# Patient Record
Sex: Female | Born: 1965 | Race: White | Hispanic: No | Marital: Single | Smoking: Never smoker
Health system: Southern US, Community
[De-identification: ages and names within clinical notes are randomized; demographics above are authoritative.]

## PROBLEM LIST (undated history)

## (undated) DIAGNOSIS — M549 Dorsalgia, unspecified: Secondary | ICD-10-CM

## (undated) DIAGNOSIS — M722 Plantar fascial fibromatosis: Secondary | ICD-10-CM

## (undated) DIAGNOSIS — M5116 Intervertebral disc disorders with radiculopathy, lumbar region: Secondary | ICD-10-CM

## (undated) DIAGNOSIS — M47816 Spondylosis without myelopathy or radiculopathy, lumbar region: Secondary | ICD-10-CM

## (undated) DIAGNOSIS — G8929 Other chronic pain: Secondary | ICD-10-CM

## (undated) DIAGNOSIS — Z87891 Personal history of nicotine dependence: Secondary | ICD-10-CM

## (undated) DIAGNOSIS — M1990 Unspecified osteoarthritis, unspecified site: Secondary | ICD-10-CM

## (undated) DIAGNOSIS — M810 Age-related osteoporosis without current pathological fracture: Secondary | ICD-10-CM

## (undated) HISTORY — DX: Plantar fascial fibromatosis: M72.2

## (undated) HISTORY — PX: ABDOMINAL HYSTERECTOMY: SHX81

## (undated) HISTORY — PX: PARTIAL HYSTERECTOMY: SHX80

## (undated) HISTORY — DX: Spondylosis without myelopathy or radiculopathy, lumbar region: M47.816

## (undated) HISTORY — PX: TONSILLECTOMY: SUR1361

## (undated) HISTORY — DX: Intervertebral disc disorders with radiculopathy, lumbar region: M51.16

## (undated) HISTORY — DX: Dorsalgia, unspecified: M54.9

## (undated) HISTORY — PX: COLONOSCOPY: SHX174

## (undated) HISTORY — DX: Personal history of nicotine dependence: Z87.891

## (undated) HISTORY — DX: Other chronic pain: G89.29

## (undated) HISTORY — DX: Age-related osteoporosis without current pathological fracture: M81.0

## (undated) HISTORY — DX: Unspecified osteoarthritis, unspecified site: M19.90

---

## 2000-02-21 ENCOUNTER — Other Ambulatory Visit: Admission: RE | Admit: 2000-02-21 | Discharge: 2000-02-21 | Payer: Self-pay | Admitting: Gynecology

## 2001-02-12 ENCOUNTER — Ambulatory Visit (HOSPITAL_COMMUNITY): Admission: RE | Admit: 2001-02-12 | Discharge: 2001-02-12 | Payer: Self-pay | Admitting: Gastroenterology

## 2017-08-30 DIAGNOSIS — M722 Plantar fascial fibromatosis: Secondary | ICD-10-CM | POA: Insufficient documentation

## 2017-08-30 DIAGNOSIS — M6701 Short Achilles tendon (acquired), right ankle: Secondary | ICD-10-CM | POA: Insufficient documentation

## 2018-01-20 DIAGNOSIS — M5136 Other intervertebral disc degeneration, lumbar region: Secondary | ICD-10-CM | POA: Insufficient documentation

## 2018-01-20 DIAGNOSIS — M47816 Spondylosis without myelopathy or radiculopathy, lumbar region: Secondary | ICD-10-CM | POA: Insufficient documentation

## 2018-01-20 DIAGNOSIS — M51369 Other intervertebral disc degeneration, lumbar region without mention of lumbar back pain or lower extremity pain: Secondary | ICD-10-CM | POA: Insufficient documentation

## 2018-02-17 DIAGNOSIS — M5416 Radiculopathy, lumbar region: Secondary | ICD-10-CM | POA: Insufficient documentation

## 2018-08-27 ENCOUNTER — Other Ambulatory Visit (HOSPITAL_COMMUNITY): Payer: Self-pay | Admitting: Neurosurgery

## 2018-08-27 ENCOUNTER — Other Ambulatory Visit: Payer: Self-pay | Admitting: Neurosurgery

## 2018-08-27 DIAGNOSIS — M5441 Lumbago with sciatica, right side: Secondary | ICD-10-CM

## 2018-09-03 MED ORDER — SODIUM CHLORIDE 0.9 % IV SOLN: 4.0000 mg | Freq: Four times a day (QID) | INTRAVENOUS | Status: DC | PRN

## 2018-09-05 ENCOUNTER — Ambulatory Visit (HOSPITAL_COMMUNITY)
Admission: RE | Admit: 2018-09-05 | Discharge: 2018-09-05 | Disposition: A | Discharge status: Home / Self Care | Payer: BLUE CROSS/BLUE SHIELD | Source: Ambulatory Visit | Attending: Neurosurgery | Admitting: Neurosurgery

## 2018-09-05 ENCOUNTER — Other Ambulatory Visit: Payer: Self-pay

## 2018-09-05 ENCOUNTER — Encounter (HOSPITAL_COMMUNITY): Payer: Self-pay | Admitting: *Deleted

## 2018-09-05 DIAGNOSIS — M5441 Lumbago with sciatica, right side: Secondary | ICD-10-CM | POA: Insufficient documentation

## 2018-09-05 DIAGNOSIS — M5126 Other intervertebral disc displacement, lumbar region: Secondary | ICD-10-CM | POA: Diagnosis not present

## 2018-09-05 DIAGNOSIS — M5136 Other intervertebral disc degeneration, lumbar region: Secondary | ICD-10-CM | POA: Insufficient documentation

## 2018-09-05 MED ORDER — LIDOCAINE HCL (PF) 1 % IJ SOLN
2.0000 mL | Freq: Once | INTRAMUSCULAR | Status: AC
  Administered 2018-09-05: 5 mL via INTRADERMAL

## 2018-09-05 MED ORDER — DIAZEPAM 5 MG PO TABS
10.0000 mg | ORAL_TABLET | Freq: Once | ORAL | Status: AC
  Administered 2018-09-05: 10 mg via ORAL
  Filled 2018-09-05: qty 2

## 2018-09-05 MED ORDER — ONDANSETRON HCL 4 MG/2ML IJ SOLN: 4.0000 mg | Freq: Four times a day (QID) | INTRAMUSCULAR | Status: DC | PRN

## 2018-09-05 MED ORDER — OXYCODONE HCL 5 MG PO TABS
5.0000 mg | ORAL_TABLET | ORAL | Status: DC | PRN
  Administered 2018-09-05: 5 mg via ORAL

## 2018-09-05 MED ORDER — IOPAMIDOL (ISOVUE-M 200) INJECTION 41%
20.0000 mL | Freq: Once | INTRAMUSCULAR | Status: AC
  Administered 2018-09-05: 20 mL via INTRATHECAL

## 2018-09-05 MED ORDER — DIAZEPAM 5 MG PO TABS
ORAL_TABLET | ORAL | Status: AC
  Filled 2018-09-05: qty 2

## 2018-09-05 MED ORDER — OXYCODONE HCL 5 MG PO TABS
ORAL_TABLET | ORAL | Status: AC
  Filled 2018-09-05: qty 1

## 2018-09-05 NOTE — Discharge Instructions (Signed)
Myelogram, Care After °Refer to this sheet in the next few weeks. These instructions provide you with information about caring for yourself after your procedure. Your health care provider may also give you more specific instructions. Your treatment has been planned according to current medical practices, but problems sometimes occur. Call your health care provider if you have any problems or questions after your procedure. °What can I expect after the procedure? °After the procedure, it is common to have: °· Soreness at your injection site. °· A mild headache. ° °Follow these instructions at home: °· Drink enough fluid to keep your urine clear or pale yellow. This will help flush out the dye (contrast material) from your spine. °· Rest as told by your health care provider. Lie flat with your head slightly raised (elevated) to reduce the risk of headache. °· Do not bend, lift, or do any strenuous activity for 24-48 hours or as told by your health care provider. °· Take over-the-counter and prescription medicines only as told by your health care provider. °· Take care of and remove your bandage (dressing) as told by your health care provider. °· Bathe or shower as told by your health care provider. °Contact a health care provider if: °· You have a fever. °· You have a headache that lasts longer than 24 hours. °· You feel nauseous or vomit. °· You have a stiff neck or numbness in your legs. °· You are unable to urinate or have a bowel movement. °· You develop a rash, itching, or sneezing. °Get help right away if: °· You have new symptoms or your symptoms get worse. °· You have a seizure. °· You have trouble breathing. °This information is not intended to replace advice given to you by your health care provider. Make sure you discuss any questions you have with your health care provider. °Document Released: 12/23/2015 Document Revised: 05/03/2016 Document Reviewed: 09/08/2015 °Elsevier Interactive Patient Education ©  2018 [employer] ° °

## 2018-09-05 NOTE — Op Note (Signed)
09/05/2018 Lumbar Myelogram  PATIENT:  Kendra Mann is a 52 y.o. female with back and lower extremity pain  PRE-OPERATIVE DIAGNOSIS:  Right lower extremity pain  POST-OPERATIVE DIAGNOSIS:  same  PROCEDURE:  Lumbar Myelogram  SURGEON:  Delance Weide  ANESTHESIA:   local LOCAL MEDICATIONS USED:  LIDOCAINE  Procedure Note: [name] is a 52 y.o. female Was taken to the fluoroscopy suite and  positioned prone on the fluoroscopy table. Her back was prepared and draped in a sterile manner. I infiltrated 10 cc into the lumbar region. I then introduced a spinal needle into the thecal sac at the L3/4 interlaminar space. I infiltrated 20cc of Isovue 200 into the thecal sac. Fluoroscopy showed the needle and contrast in the thecal sac. [name] tolerated the procedure well. she Will be taken to CT for evaluation.     PATIENT DISPOSITION:  Short Stay

## 2018-10-22 ENCOUNTER — Other Ambulatory Visit: Payer: Self-pay | Admitting: *Deleted

## 2018-10-22 ENCOUNTER — Other Ambulatory Visit: Payer: Self-pay | Admitting: Neurosurgery

## 2018-10-23 ENCOUNTER — Telehealth: Payer: Self-pay | Admitting: Vascular Surgery

## 2018-10-23 NOTE — Telephone Encounter (Signed)
sch appt spk to pt mld ltr 12/16/18 1130am f/u MD

## 2018-10-23 NOTE — Telephone Encounter (Signed)
-----   Message from Retta Macebecca J Roberts, RN sent at 10/22/2018 10:18 AM EST ----- Regarding: OFFICE APPOINTMENT Please schedule office appointment with Dr. Chestine SporeLARK. ALIF surgery is scheduled for 12/26/2018 with Dr. Franky Machoabbell. Remind patient to bring all film related to this surgery that are not in Epic. Thanks, [employer] EnergyBecky

## 2018-11-12 ENCOUNTER — Encounter: Payer: Self-pay | Admitting: *Deleted

## 2018-12-16 ENCOUNTER — Encounter: Payer: Self-pay | Admitting: Vascular Surgery

## 2018-12-16 ENCOUNTER — Other Ambulatory Visit: Payer: Self-pay

## 2018-12-16 ENCOUNTER — Ambulatory Visit (INDEPENDENT_AMBULATORY_CARE_PROVIDER_SITE_OTHER): Payer: BLUE CROSS/BLUE SHIELD | Admitting: Vascular Surgery

## 2018-12-16 VITALS — BP 137/95 | HR 76 | Temp 97.0°F | Resp 20 | Ht 66.0 in | Wt 252.0 lb

## 2018-12-16 DIAGNOSIS — M5136 Other intervertebral disc degeneration, lumbar region: Secondary | ICD-10-CM | POA: Diagnosis not present

## 2018-12-16 DIAGNOSIS — M51369 Other intervertebral disc degeneration, lumbar region without mention of lumbar back pain or lower extremity pain: Secondary | ICD-10-CM

## 2018-12-16 NOTE — Progress Notes (Signed)
Patient name: Katharina CaperMelissa L Valtierra MRN: [id number] DOB: 11-27-1966 Sex: female  REASON FOR CONSULT: New evaluation prior to L4-L5 ALIF  HPI: Mystie L Standlee is a 53 y.o. female, with history of morbid obesity that presents for preop evaluation for L4-L5 AlLIF with Dr. Franky Machoabbell.  Patient states she has had pretty profound lower back pain that has limited her mobility.  As a result she has gained over 100 pounds.  She has been evaluated by Dr. Franky Machoabbell and he has recommended an L4-L5 anterior fusion next Friday.  Patient denies any previous abdominal surgeries other than a laparoscopic procedure through her bellybutton.  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  She also had a transvaginal hysterectomy.  No blood thinners.    Past Medical History:  Diagnosis Date  . Back pain   . Lumbar disc disease with radiculopathy   . Lumbar spondylosis   . Plantar fasciitis    PSH: Laparoscopic procedure through belly button, transvaginal hysterectomy  FH: No hx of bleeding disorders or issues with anesthesia   SOCIAL HISTORY: Social History   Socioeconomic History  . Marital status: Single    Spouse name: Not on file  . Number of children: Not on file  . Years of education: Not on file  . Highest education level: Not on file  Occupational History  . Not on file  Social Needs  . Financial resource strain: Not on file  . Food insecurity:    Worry: Not on file    Inability: Not on file  . Transportation needs:    Medical: Not on file    Non-medical: Not on file  Tobacco Use  . Smoking status: Never Smoker  . Smokeless tobacco: Never Used  Substance and Sexual Activity  . Alcohol use: Yes    Comment: occasionally  . Drug use: Never  . Sexual activity: Not on file  Lifestyle  . Physical activity:    Days per week: Not on file    Minutes per session: Not on file  . Stress: Not on file  Relationships  . Social connections:    Talks on phone: Not on file    Gets together: Not on file    Attends religious service: Not on file    Active member of club or organization: Not on file    Attends meetings of clubs or organizations: Not on file    Relationship status: Not on file  . Intimate partner violence:    Fear of current or ex partner: Not on file    Emotionally abused: Not on file    Physically abused: Not on file    Forced sexual activity: Not on file  Other Topics Concern  . Not on file  Social History Narrative  . Not on file    No Known Allergies  Current Outpatient Medications  Medication Sig Dispense Refill  . conjugated estrogens (PREMARIN) vaginal cream Place 0.5 Applicatorfuls vaginally 2 (two) times a week.    Marland Kitchen. HYDROcodone-acetaminophen (NORCO) 7.5-325 MG tablet Take 1 tablet by mouth every 4 (four) hours as needed for moderate pain or severe pain.     Marland Kitchen. ibuprofen (ADVIL,MOTRIN) 200 MG tablet Take 800 mg by mouth every 6 (six) hours as needed for headache or moderate pain.      No current facility-administered medications for this visit.    Facility-Administered Medications Ordered in Other Visits  Medication Dose Route Frequency Provider Last Rate Last Dose  . ondansetron (ZOFRAN) 4  mg in sodium chloride 0.9 % 50 mL IVPB  4 mg Intravenous Q6H PRN Coletta Memos, MD        REVIEW OF SYSTEMS:  [X]  denotes positive finding, [ ]  denotes negative finding Cardiac  Comments:  Chest pain or chest pressure:    Shortness of breath upon exertion:    Short of breath when lying flat:    Irregular heart rhythm:        Vascular    Pain in calf, thigh, or hip brought on by ambulation:    Pain in feet at night that wakes you up from your sleep:     Blood clot in your veins:    Leg swelling:         Pulmonary    Oxygen at home:    Productive cough:     Wheezing:         Neurologic    Sudden weakness in arms or legs:     Sudden numbness in arms or legs:     Sudden onset of difficulty speaking or slurred speech:    Temporary loss of vision in one eye:       Problems with dizziness:         Gastrointestinal    Blood in stool:     Vomited blood:         Genitourinary    Burning when urinating:     Blood in urine:        Psychiatric    Major depression:         Hematologic    Bleeding problems:    Problems with blood clotting too easily:        Skin    Rashes or ulcers:        Constitutional    Fever or chills:      PHYSICAL EXAM: Vitals:   12/16/18 1111  BP: (!) 137/95  Pulse: 76  Resp: 20  Temp: (!) 97 F (36.1 C)  TempSrc: Oral  SpO2: 100%  Weight: 252 lb (114.3 kg)  Height: 5\' 6"  (1.676 m)    GENERAL: The patient is a well-nourished female, in no acute distress. The vital signs are documented above. CARDIAC: There is a regular rate and rhythm.  VASCULAR:  2+ femoral pulse palpable bilateral groins 2+ DP palpable bilateral lower extremities PULMONARY: There is good air exchange bilaterally without wheezing or rales. ABDOMEN: Soft and non-tender.  Obese.   MUSCULOSKELETAL: There are no major deformities or cyanosis. NEUROLOGIC: No focal weakness or paresthesias are detected. SKIN: There are no ulcers or rashes noted. PSYCHIATRIC: The patient has a normal affect.  DATA:   I reviewed her CT lumbar spine and it appears that her aortic bifurcation is at the level of L4-L5.  No significant atherosclerotic disease apparent.  Without contrast difficult to see iliolumbar veins.  Assessment/Plan:  53 year old female scheduled for a L4-L5 ALIF with Dr. Franky Macho next Friday.  Discussed details of surgery with the patient today including plans for left paramedian incision with mobilization of the peritoneum, ureter, artery and veins to visualize disc space.  We discussed risks of the procedure including bowel injury, ureter injury, injury to arteries or veins with bleeding, etc.  Patient understands and wishes to proceed.  Discussed with her that this would be more difficult given her morbid obesity.  All questions answered  and look forward to assisting Dr. Franky Macho.     [name], MD [facility] Office: [phone]  Pager: [phone]   [name]

## 2018-12-18 NOTE — Pre-Procedure Instructions (Signed)
Hali L Isaacs  12/18/2018      [id number] - [place], [place] - [address] [place] [address] Phone: [phone] Fax: [phone]    Your procedure is scheduled on Friday January 17th.  Report to [facility] Admitting at 0530 A.M.  Call this number if you have problems the morning of surgery:  [phone]   Remember:  Do not eat or drink after midnight.    Take these medicines the morning of surgery with A SIP OF WATER  HYDROcodone-acetaminophen (NORCO) if needed  7 days prior to surgery STOP taking any Aspirin (unless otherwise instructed by your surgeon), Aleve, Naproxen, Ibuprofen, Motrin, Advil, Goody's, BC's, all herbal medications, fish oil, and all vitamins.     Do not wear jewelry, make-up or nail polish.  Do not wear lotions, powders, or perfumes, or deodorant.  Do not shave 48 hours prior to surgery.  Men may shave face and neck.  Do not bring valuables to the hospital.  [facility] is not responsible for any belongings or valuables.  Contacts, dentures or bridgework may not be worn into surgery.  Leave your suitcase in the car.  After surgery it may be brought to your room.  For patients admitted to the hospital, discharge time will be determined by your treatment team.  Patients discharged the day of surgery will not be allowed to drive home.    [place]- Preparing For Surgery  Before surgery, you can play an important role. Because skin is not sterile, your skin needs to be as free of germs as possible. You can reduce the number of germs on your skin by washing with CHG (chlorahexidine gluconate) Soap before surgery.  CHG is an antiseptic cleaner which kills germs and bonds with the skin to continue killing germs even after washing.    Oral Hygiene is also important to reduce your risk of infection.  Remember - BRUSH YOUR TEETH THE MORNING OF SURGERY WITH YOUR REGULAR TOOTHPASTE  Please do not use if you have  an allergy to CHG or antibacterial soaps. If your skin becomes reddened/irritated stop using the CHG.  Do not shave (including legs and underarms) for at least 48 hours prior to first CHG shower. It is OK to shave your face.  Please follow these instructions carefully.   1. Shower the NIGHT BEFORE SURGERY and the MORNING OF SURGERY with CHG.   2. If you chose to wash your hair, wash your hair first as usual with your normal shampoo.  3. After you shampoo, rinse your hair and body thoroughly to remove the shampoo.  4. Use CHG as you would any other liquid soap. You can apply CHG directly to the skin and wash gently with a scrungie or a clean washcloth.   5. Apply the CHG Soap to your body ONLY FROM THE NECK DOWN.  Do not use on open wounds or open sores. Avoid contact with your eyes, ears, mouth and genitals (private parts). Wash Face and genitals (private parts)  with your normal soap.  6. Wash thoroughly, paying special attention to the area where your surgery will be performed.  7. Thoroughly rinse your body with warm water from the neck down.  8. DO NOT shower/wash with your normal soap after using and rinsing off the CHG Soap.  9. Pat yourself dry with a CLEAN TOWEL.  10. Wear CLEAN PAJAMAS to bed the night before surgery, wear comfortable clothes the morning  of surgery  11. Place CLEAN SHEETS on your bed the night of your first shower and DO NOT SLEEP WITH PETS.    Day of Surgery:  Do not apply any deodorants/lotions.  Please wear clean clothes to the hospital/surgery center.   Remember to brush your teeth WITH YOUR REGULAR TOOTHPASTE.    Please read over the following fact sheets that you were given.

## 2018-12-19 ENCOUNTER — Encounter (HOSPITAL_COMMUNITY): Payer: Self-pay

## 2018-12-19 ENCOUNTER — Inpatient Hospital Stay (HOSPITAL_COMMUNITY)
Admission: RE | Admit: 2018-12-19 | Discharge: 2018-12-19 | Disposition: A | Discharge status: Home / Self Care | Payer: BLUE CROSS/BLUE SHIELD | Source: Ambulatory Visit

## 2018-12-19 ENCOUNTER — Other Ambulatory Visit: Payer: Self-pay

## 2018-12-19 NOTE — Progress Notes (Signed)
Pt scheduled pre-op appt but couldn't find parking and missed appt. Phone call made. Pt denies seeing cardiologist, having CP or cough. Pt informed to be NPO after MN and only take a norco in the morning if needed with a small sip of water.

## 2018-12-25 NOTE — Anesthesia Preprocedure Evaluation (Addendum)
Anesthesia Evaluation  Patient identified by MRN, date of birth, ID band Patient awake    Reviewed: Allergy & Precautions, H&P , NPO status , Patient's Chart, lab work & pertinent test results, reviewed documented beta blocker date and time   Airway Mallampati: II  TM Distance: >3 FB Neck ROM: full    Dental no notable dental hx. (+) Dental Advisory Given, Teeth Intact   Pulmonary neg pulmonary ROS,    Pulmonary exam normal breath sounds clear to auscultation       Cardiovascular Exercise Tolerance: Good negative cardio ROS   Rhythm:regular Rate:Normal     Neuro/Psych negative neurological ROS  negative psych ROS   GI/Hepatic negative GI ROS, Neg liver ROS,   Endo/Other  Morbid obesity  Renal/GU negative Renal ROS  negative genitourinary   Musculoskeletal   Abdominal   Peds  Hematology negative hematology ROS (+)   Anesthesia Other Findings   Reproductive/Obstetrics negative OB ROS                            Anesthesia Physical Anesthesia Plan  ASA: II  Anesthesia Plan: General   Post-op Pain Management:    Induction: Intravenous  PONV Risk Score and Plan: 3 and Ondansetron, Dexamethasone, Treatment may vary due to age or medical condition and Midazolam  Airway Management Planned: Oral ETT  Additional Equipment:   Intra-op Plan:   Post-operative Plan: Extubation in OR  Informed Consent: I have reviewed the patients History and Physical, chart, labs and discussed the procedure including the risks, benefits and alternatives for the proposed anesthesia with the patient or authorized representative who has indicated his/her understanding and acceptance.     Dental Advisory Given  Plan Discussed with: CRNA, Anesthesiologist and Surgeon  Anesthesia Plan Comments: (  )        Anesthesia Quick Evaluation

## 2018-12-26 ENCOUNTER — Inpatient Hospital Stay (HOSPITAL_COMMUNITY): Payer: BLUE CROSS/BLUE SHIELD | Admitting: Certified Registered"

## 2018-12-26 ENCOUNTER — Encounter (HOSPITAL_COMMUNITY)
Admission: RE | Disposition: A | Discharge status: Home Health | Payer: Self-pay | Source: Home / Self Care | Attending: Neurosurgery

## 2018-12-26 ENCOUNTER — Inpatient Hospital Stay (HOSPITAL_COMMUNITY): Payer: BLUE CROSS/BLUE SHIELD

## 2018-12-26 ENCOUNTER — Inpatient Hospital Stay (HOSPITAL_COMMUNITY)
Admission: RE | Admit: 2018-12-26 | Discharge: 2018-12-27 | DRG: 460 | Disposition: A | Discharge status: Home Health | Payer: BLUE CROSS/BLUE SHIELD | Attending: Neurosurgery | Admitting: Neurosurgery

## 2018-12-26 ENCOUNTER — Encounter (HOSPITAL_COMMUNITY): Payer: Self-pay

## 2018-12-26 DIAGNOSIS — M4726 Other spondylosis with radiculopathy, lumbar region: Secondary | ICD-10-CM | POA: Diagnosis present

## 2018-12-26 DIAGNOSIS — Z6841 Body Mass Index (BMI) 40.0 and over, adult: Secondary | ICD-10-CM

## 2018-12-26 DIAGNOSIS — Z79890 Hormone replacement therapy (postmenopausal): Secondary | ICD-10-CM | POA: Diagnosis not present

## 2018-12-26 DIAGNOSIS — Z90710 Acquired absence of both cervix and uterus: Secondary | ICD-10-CM | POA: Diagnosis not present

## 2018-12-26 DIAGNOSIS — M47816 Spondylosis without myelopathy or radiculopathy, lumbar region: Secondary | ICD-10-CM | POA: Diagnosis present

## 2018-12-26 DIAGNOSIS — M4316 Spondylolisthesis, lumbar region: Secondary | ICD-10-CM | POA: Diagnosis present

## 2018-12-26 DIAGNOSIS — Z79891 Long term (current) use of opiate analgesic: Secondary | ICD-10-CM

## 2018-12-26 DIAGNOSIS — M4306 Spondylolysis, lumbar region: Secondary | ICD-10-CM

## 2018-12-26 HISTORY — PX: ABDOMINAL EXPOSURE: SHX5708

## 2018-12-26 HISTORY — PX: ANTERIOR LUMBAR FUSION: SHX1170

## 2018-12-26 LAB — CBC
HCT: 38.5 % (ref 36.0–46.0)
Hemoglobin: 12.1 g/dL (ref 12.0–15.0)
MCH: 30.6 pg (ref 26.0–34.0)
MCHC: 31.4 g/dL (ref 30.0–36.0)
MCV: 97.2 fL (ref 80.0–100.0)
Platelets: 249 10*3/uL (ref 150–400)
RBC: 3.96 MIL/uL (ref 3.87–5.11)
RDW: 13.1 % (ref 11.5–15.5)
WBC: 6.6 10*3/uL (ref 4.0–10.5)
nRBC: 0 % (ref 0.0–0.2)

## 2018-12-26 LAB — COMPREHENSIVE METABOLIC PANEL
ALT: 16 U/L (ref 0–44)
AST: 17 U/L (ref 15–41)
Albumin: 4 g/dL (ref 3.5–5.0)
Alkaline Phosphatase: 55 U/L (ref 38–126)
Anion gap: 10 (ref 5–15)
BUN: 12 mg/dL (ref 6–20)
CO2: 24 mmol/L (ref 22–32)
Calcium: 9.4 mg/dL (ref 8.9–10.3)
Chloride: 103 mmol/L (ref 98–111)
Creatinine, Ser: 0.84 mg/dL (ref 0.44–1.00)
GFR calc Af Amer: >60 mL/min (ref >60)
GFR calc non Af Amer: >60 mL/min (ref >60)
Glucose, Bld: 97 mg/dL (ref 70–99)
Potassium: 3.9 mmol/L (ref 3.5–5.1)
Sodium: 137 mmol/L (ref 135–145)
Total Bilirubin: 0.5 mg/dL (ref 0.3–1.2)
Total Protein: 6.8 g/dL (ref 6.5–8.1)

## 2018-12-26 LAB — TYPE AND SCREEN
ABO/RH(D): O POS
Antibody Screen: NEGATIVE

## 2018-12-26 LAB — ABO/RH: ABO/RH(D): O POS

## 2018-12-26 SURGERY — ANTERIOR LUMBAR FUSION 1 LEVEL
Anesthesia: General

## 2018-12-26 MED ORDER — KETOROLAC TROMETHAMINE 30 MG/ML IJ SOLN: 30.0000 mg | Freq: Four times a day (QID) | INTRAMUSCULAR | Status: DC | PRN

## 2018-12-26 MED ORDER — ROCURONIUM BROMIDE 50 MG/5ML IV SOSY: PREFILLED_SYRINGE | INTRAVENOUS | Status: DC | PRN

## 2018-12-26 MED ORDER — METHOCARBAMOL 500 MG PO TABS
ORAL_TABLET | ORAL | Status: AC
  Filled 2018-12-26: qty 1

## 2018-12-26 MED ORDER — BUPIVACAINE HCL (PF) 0.5 % IJ SOLN
INTRAMUSCULAR | Status: AC
  Filled 2018-12-26: qty 30

## 2018-12-26 MED ORDER — LIDOCAINE-EPINEPHRINE 1 %-1:100000 IJ SOLN
INTRAMUSCULAR | Status: AC
  Filled 2018-12-26: qty 1

## 2018-12-26 MED ORDER — EPHEDRINE 5 MG/ML INJ
INTRAVENOUS | Status: AC
  Filled 2018-12-26: qty 10

## 2018-12-26 MED ORDER — CEFAZOLIN SODIUM-DEXTROSE 2-4 GM/100ML-% IV SOLN
2.0000 g | Freq: Three times a day (TID) | INTRAVENOUS | Status: AC
  Administered 2018-12-26 (×2): 2 g via INTRAVENOUS
  Filled 2018-12-26 (×2): qty 100

## 2018-12-26 MED ORDER — CHLORHEXIDINE GLUCONATE CLOTH 2 % EX PADS: 6.0000 | MEDICATED_PAD | Freq: Once | CUTANEOUS | Status: DC

## 2018-12-26 MED ORDER — ACETAMINOPHEN 650 MG RE SUPP: 650.0000 mg | RECTAL | Status: DC | PRN

## 2018-12-26 MED ORDER — SODIUM CHLORIDE 0.9% FLUSH: 3.0000 mL | Freq: Two times a day (BID) | INTRAVENOUS | Status: DC

## 2018-12-26 MED ORDER — OXYCODONE HCL 5 MG PO TABS
ORAL_TABLET | ORAL | Status: AC
  Filled 2018-12-26: qty 2

## 2018-12-26 MED ORDER — PHENOL 1.4 % MT LIQD: 1.0000 | OROMUCOSAL | Status: DC | PRN

## 2018-12-26 MED ORDER — OXYCODONE HCL 5 MG PO TABS
10.0000 mg | ORAL_TABLET | ORAL | Status: DC | PRN
  Administered 2018-12-26 – 2018-12-27 (×4): 10 mg via ORAL
  Filled 2018-12-26 (×3): qty 2

## 2018-12-26 MED ORDER — FENTANYL CITRATE (PF) 100 MCG/2ML IJ SOLN
INTRAMUSCULAR | Status: AC
  Filled 2018-12-26: qty 2

## 2018-12-26 MED ORDER — DEXAMETHASONE SODIUM PHOSPHATE 10 MG/ML IJ SOLN
INTRAMUSCULAR | Status: DC | PRN
  Administered 2018-12-26: 10 mg via INTRAVENOUS

## 2018-12-26 MED ORDER — PHENYLEPHRINE 40 MCG/ML (10ML) SYRINGE FOR IV PUSH (FOR BLOOD PRESSURE SUPPORT)
PREFILLED_SYRINGE | INTRAVENOUS | Status: DC | PRN
  Administered 2018-12-26 (×3): 80 ug via INTRAVENOUS

## 2018-12-26 MED ORDER — SODIUM CHLORIDE 0.9% FLUSH: 3.0000 mL | INTRAVENOUS | Status: DC | PRN

## 2018-12-26 MED ORDER — OXYCODONE HCL 5 MG PO TABS: 5.0000 mg | ORAL_TABLET | Freq: Once | ORAL | Status: DC | PRN

## 2018-12-26 MED ORDER — ACETAMINOPHEN 325 MG PO TABS: 325.0000 mg | ORAL_TABLET | ORAL | Status: DC | PRN

## 2018-12-26 MED ORDER — FENTANYL CITRATE (PF) 250 MCG/5ML IJ SOLN
INTRAMUSCULAR | Status: AC
  Filled 2018-12-26: qty 5

## 2018-12-26 MED ORDER — ESTROGENS, CONJUGATED 0.625 MG/GM VA CREA
0.5000 | TOPICAL_CREAM | VAGINAL | Status: DC
  Filled 2018-12-26: qty 30

## 2018-12-26 MED ORDER — KETOROLAC TROMETHAMINE 30 MG/ML IJ SOLN
30.0000 mg | Freq: Four times a day (QID) | INTRAMUSCULAR | Status: DC
  Administered 2018-12-26 – 2018-12-27 (×2): 30 mg via INTRAVENOUS
  Filled 2018-12-26 (×2): qty 1

## 2018-12-26 MED ORDER — DEXAMETHASONE SODIUM PHOSPHATE 10 MG/ML IJ SOLN
INTRAMUSCULAR | Status: AC
  Filled 2018-12-26: qty 1

## 2018-12-26 MED ORDER — HYDROCODONE-ACETAMINOPHEN 5-325 MG PO TABS: 1.0000 | ORAL_TABLET | ORAL | Status: DC | PRN

## 2018-12-26 MED ORDER — ROCURONIUM BROMIDE 50 MG/5ML IV SOSY
PREFILLED_SYRINGE | INTRAVENOUS | Status: AC
  Filled 2018-12-26: qty 10

## 2018-12-26 MED ORDER — MORPHINE SULFATE (PF) 2 MG/ML IV SOLN
2.0000 mg | INTRAVENOUS | Status: DC | PRN
  Administered 2018-12-26: 2 mg via INTRAVENOUS
  Filled 2018-12-26: qty 1

## 2018-12-26 MED ORDER — METHOCARBAMOL 500 MG PO TABS
500.0000 mg | ORAL_TABLET | Freq: Four times a day (QID) | ORAL | Status: DC | PRN
  Administered 2018-12-26 – 2018-12-27 (×4): 500 mg via ORAL
  Filled 2018-12-26 (×3): qty 1

## 2018-12-26 MED ORDER — ONDANSETRON HCL 4 MG/2ML IJ SOLN
INTRAMUSCULAR | Status: AC
  Filled 2018-12-26: qty 2

## 2018-12-26 MED ORDER — ACETAMINOPHEN 160 MG/5ML PO SOLN: 325.0000 mg | ORAL | Status: DC | PRN

## 2018-12-26 MED ORDER — CHLORHEXIDINE GLUCONATE 4 % EX LIQD: 60.0000 mL | Freq: Once | CUTANEOUS | Status: DC

## 2018-12-26 MED ORDER — ACETAMINOPHEN 10 MG/ML IV SOLN
INTRAVENOUS | Status: AC
  Administered 2018-12-26: 1000 mg
  Filled 2018-12-26: qty 100

## 2018-12-26 MED ORDER — THROMBIN 5000 UNITS EX SOLR
CUTANEOUS | Status: AC
  Filled 2018-12-26: qty 15000

## 2018-12-26 MED ORDER — SUGAMMADEX SODIUM 200 MG/2ML IV SOLN
INTRAVENOUS | Status: DC | PRN
  Administered 2018-12-26: 300 mg via INTRAVENOUS

## 2018-12-26 MED ORDER — ONDANSETRON HCL 4 MG/2ML IJ SOLN: 4.0000 mg | Freq: Four times a day (QID) | INTRAMUSCULAR | Status: DC | PRN

## 2018-12-26 MED ORDER — PHENYLEPHRINE 40 MCG/ML (10ML) SYRINGE FOR IV PUSH (FOR BLOOD PRESSURE SUPPORT)
PREFILLED_SYRINGE | INTRAVENOUS | Status: AC
  Filled 2018-12-26: qty 10

## 2018-12-26 MED ORDER — DOCUSATE SODIUM 100 MG PO CAPS
100.0000 mg | ORAL_CAPSULE | Freq: Two times a day (BID) | ORAL | Status: DC
  Administered 2018-12-26 – 2018-12-27 (×2): 100 mg via ORAL
  Filled 2018-12-26 (×2): qty 1

## 2018-12-26 MED ORDER — POLYETHYLENE GLYCOL 3350 17 G PO PACK: 17.0000 g | PACK | Freq: Every day | ORAL | Status: DC | PRN

## 2018-12-26 MED ORDER — ZOLPIDEM TARTRATE 5 MG PO TABS: 5.0000 mg | ORAL_TABLET | Freq: Every evening | ORAL | Status: DC | PRN

## 2018-12-26 MED ORDER — OXYCODONE HCL 5 MG/5ML PO SOLN: 5.0000 mg | Freq: Once | ORAL | Status: DC | PRN

## 2018-12-26 MED ORDER — LACTATED RINGERS IV SOLN
INTRAVENOUS | Status: DC | PRN
  Administered 2018-12-26 (×2): via INTRAVENOUS

## 2018-12-26 MED ORDER — THROMBIN 5000 UNITS EX SOLR
OROMUCOSAL | Status: DC | PRN
  Administered 2018-12-26: 08:00:00 via TOPICAL

## 2018-12-26 MED ORDER — METHOCARBAMOL 1000 MG/10ML IJ SOLN
500.0000 mg | Freq: Four times a day (QID) | INTRAVENOUS | Status: DC | PRN
  Filled 2018-12-26: qty 5

## 2018-12-26 MED ORDER — ONDANSETRON HCL 4 MG PO TABS: 4.0000 mg | ORAL_TABLET | Freq: Four times a day (QID) | ORAL | Status: DC | PRN

## 2018-12-26 MED ORDER — PROPOFOL 10 MG/ML IV BOLUS
INTRAVENOUS | Status: DC | PRN
  Administered 2018-12-26: 160 mg via INTRAVENOUS

## 2018-12-26 MED ORDER — SUGAMMADEX SODIUM 500 MG/5ML IV SOLN
INTRAVENOUS | Status: AC
  Filled 2018-12-26: qty 5

## 2018-12-26 MED ORDER — HYDROCODONE-ACETAMINOPHEN 7.5-325 MG PO TABS
1.0000 | ORAL_TABLET | ORAL | Status: DC | PRN
  Administered 2018-12-27: 1 via ORAL
  Filled 2018-12-26: qty 1

## 2018-12-26 MED ORDER — BISACODYL 10 MG RE SUPP: 10.0000 mg | Freq: Every day | RECTAL | Status: DC | PRN

## 2018-12-26 MED ORDER — SODIUM CHLORIDE 0.9 % IV SOLN
INTRAVENOUS | Status: DC | PRN
  Administered 2018-12-26: 40 ug/min via INTRAVENOUS

## 2018-12-26 MED ORDER — ONDANSETRON HCL 4 MG/2ML IJ SOLN
INTRAMUSCULAR | Status: DC | PRN
  Administered 2018-12-26 (×2): 4 mg via INTRAVENOUS

## 2018-12-26 MED ORDER — ONDANSETRON HCL 4 MG/2ML IJ SOLN: 4.0000 mg | Freq: Once | INTRAMUSCULAR | Status: DC | PRN

## 2018-12-26 MED ORDER — KCL IN DEXTROSE-NACL 20-5-0.45 MEQ/L-%-% IV SOLN: INTRAVENOUS | Status: DC

## 2018-12-26 MED ORDER — MIDAZOLAM HCL 5 MG/5ML IJ SOLN
INTRAMUSCULAR | Status: DC | PRN
  Administered 2018-12-26: 2 mg via INTRAVENOUS

## 2018-12-26 MED ORDER — ROCURONIUM BROMIDE 50 MG/5ML IV SOSY
PREFILLED_SYRINGE | INTRAVENOUS | Status: DC | PRN
  Administered 2018-12-26: 70 mg via INTRAVENOUS
  Administered 2018-12-26: 50 mg via INTRAVENOUS
  Administered 2018-12-26: 30 mg via INTRAVENOUS

## 2018-12-26 MED ORDER — LIDOCAINE 2% (20 MG/ML) 5 ML SYRINGE
INTRAMUSCULAR | Status: DC | PRN
  Administered 2018-12-26: 80 mg via INTRAVENOUS

## 2018-12-26 MED ORDER — MEPERIDINE HCL 50 MG/ML IJ SOLN: 6.2500 mg | INTRAMUSCULAR | Status: DC | PRN

## 2018-12-26 MED ORDER — ALUM & MAG HYDROXIDE-SIMETH 200-200-20 MG/5ML PO SUSP: 30.0000 mL | Freq: Four times a day (QID) | ORAL | Status: DC | PRN

## 2018-12-26 MED ORDER — FLEET ENEMA 7-19 GM/118ML RE ENEM: 1.0000 | ENEMA | Freq: Once | RECTAL | Status: DC | PRN

## 2018-12-26 MED ORDER — 0.9 % SODIUM CHLORIDE (POUR BTL) OPTIME
TOPICAL | Status: DC | PRN
  Administered 2018-12-26: 1000 mL

## 2018-12-26 MED ORDER — SUCCINYLCHOLINE CHLORIDE 200 MG/10ML IV SOSY
PREFILLED_SYRINGE | INTRAVENOUS | Status: DC | PRN
  Administered 2018-12-26: 200 mg via INTRAVENOUS

## 2018-12-26 MED ORDER — MIDAZOLAM HCL 2 MG/2ML IJ SOLN
INTRAMUSCULAR | Status: AC
  Filled 2018-12-26: qty 2

## 2018-12-26 MED ORDER — LIDOCAINE 2% (20 MG/ML) 5 ML SYRINGE
INTRAMUSCULAR | Status: AC
  Filled 2018-12-26: qty 5

## 2018-12-26 MED ORDER — PANTOPRAZOLE SODIUM 40 MG IV SOLR
40.0000 mg | Freq: Every day | INTRAVENOUS | Status: DC
  Administered 2018-12-26: 40 mg via INTRAVENOUS
  Filled 2018-12-26: qty 40

## 2018-12-26 MED ORDER — CEFAZOLIN SODIUM-DEXTROSE 2-4 GM/100ML-% IV SOLN
2.0000 g | INTRAVENOUS | Status: AC
  Administered 2018-12-26: 2 g via INTRAVENOUS
  Filled 2018-12-26: qty 100

## 2018-12-26 MED ORDER — ROCURONIUM BROMIDE 50 MG/5ML IV SOSY
PREFILLED_SYRINGE | INTRAVENOUS | Status: AC
  Filled 2018-12-26: qty 5

## 2018-12-26 MED ORDER — BUPIVACAINE HCL 0.5 % IJ SOLN
INTRAMUSCULAR | Status: DC | PRN
  Administered 2018-12-26: 20 mL

## 2018-12-26 MED ORDER — FENTANYL CITRATE (PF) 100 MCG/2ML IJ SOLN
INTRAMUSCULAR | Status: DC | PRN
  Administered 2018-12-26 (×2): 100 ug via INTRAVENOUS
  Administered 2018-12-26: 150 ug via INTRAVENOUS
  Administered 2018-12-26 (×3): 50 ug via INTRAVENOUS

## 2018-12-26 MED ORDER — PROPOFOL 10 MG/ML IV BOLUS
INTRAVENOUS | Status: AC
  Filled 2018-12-26: qty 20

## 2018-12-26 MED ORDER — MENTHOL 3 MG MT LOZG: 1.0000 | LOZENGE | OROMUCOSAL | Status: DC | PRN

## 2018-12-26 MED ORDER — FENTANYL CITRATE (PF) 100 MCG/2ML IJ SOLN
25.0000 ug | INTRAMUSCULAR | Status: DC | PRN
  Administered 2018-12-26 (×3): 50 ug via INTRAVENOUS

## 2018-12-26 MED ORDER — THROMBIN 5000 UNITS EX SOLR
CUTANEOUS | Status: AC
  Filled 2018-12-26: qty 5000

## 2018-12-26 MED ORDER — ACETAMINOPHEN 325 MG PO TABS: 650.0000 mg | ORAL_TABLET | ORAL | Status: DC | PRN

## 2018-12-26 SURGICAL SUPPLY — 90 items
ADH SKN CLS APL DERMABOND .7 (GAUZE/BANDAGES/DRESSINGS) ×1
APPLIER CLIP 11 MED OPEN (CLIP)
APR CLP MED 11 20 MLT OPN (CLIP)
BASKET BONE COLLECTION (BASKET) IMPLANT
BOLT BASE TI 5X20 VARIABLE (Bolt) ×3 IMPLANT
BUR BARREL STRAIGHT FLUTE 4.0 (BURR) IMPLANT
CANISTER SUCT 3000ML PPV (MISCELLANEOUS) ×2 IMPLANT
CLIP APPLIE 11 MED OPEN (CLIP) ×2 IMPLANT
CLIP LIGATING EXTRA MED SLVR (CLIP) ×1 IMPLANT
CLIP LIGATING EXTRA SM BLUE (MISCELLANEOUS) ×1 IMPLANT
COVER BACK TABLE 60X90IN (DRAPES) ×2 IMPLANT
COVER WAND RF STERILE (DRAPES) ×3 IMPLANT
DECANTER SPIKE VIAL GLASS SM (MISCELLANEOUS) ×1 IMPLANT
DERMABOND ADVANCED (GAUZE/BANDAGES/DRESSINGS) ×1
DERMABOND ADVANCED .7 DNX12 (GAUZE/BANDAGES/DRESSINGS) ×2 IMPLANT
DRAPE C-ARM 42X72 X-RAY (DRAPES) ×2 IMPLANT
DRAPE C-ARMOR (DRAPES) ×2 IMPLANT
DRAPE LAPAROTOMY 100X72X124 (DRAPES) ×2 IMPLANT
DRSG OPSITE POSTOP 4X8 (GAUZE/BANDAGES/DRESSINGS) ×1 IMPLANT
DURAPREP 26ML APPLICATOR (WOUND CARE) ×2 IMPLANT
ELECT BLADE 4.0 EZ CLEAN MEGAD (MISCELLANEOUS) ×2
ELECT REM PT RETURN 9FT ADLT (ELECTROSURGICAL) ×2
ELECTRODE BLDE 4.0 EZ CLN MEGD (MISCELLANEOUS) ×1 IMPLANT
ELECTRODE REM PT RTRN 9FT ADLT (ELECTROSURGICAL) ×1 IMPLANT
GAUZE 4X4 16PLY RFD (DISPOSABLE) IMPLANT
GAUZE SPONGE 4X4 12PLY STRL (GAUZE/BANDAGES/DRESSINGS) IMPLANT
GLOVE BIO SURGEON STRL SZ7.5 (GLOVE) ×2 IMPLANT
GLOVE BIO SURGEON STRL SZ8 (GLOVE) ×2 IMPLANT
GLOVE BIOGEL PI IND STRL 8 (GLOVE) ×2 IMPLANT
GLOVE BIOGEL PI IND STRL 8.5 (GLOVE) ×1 IMPLANT
GLOVE BIOGEL PI INDICATOR 8 (GLOVE) ×2
GLOVE BIOGEL PI INDICATOR 8.5 (GLOVE) ×1
GLOVE ECLIPSE 8.0 STRL XLNG CF (GLOVE) ×2 IMPLANT
GLOVE EXAM NITRILE XL STR (GLOVE) IMPLANT
GLOVE SS BIOGEL STRL SZ 7.5 (GLOVE) ×1 IMPLANT
GLOVE SUPERSENSE BIOGEL SZ 7.5 (GLOVE) ×1
GOWN STRL REUS W/ TWL LRG LVL3 (GOWN DISPOSABLE) IMPLANT
GOWN STRL REUS W/ TWL XL LVL3 (GOWN DISPOSABLE) ×2 IMPLANT
GOWN STRL REUS W/TWL 2XL LVL3 (GOWN DISPOSABLE) ×2 IMPLANT
GOWN STRL REUS W/TWL LRG LVL3 (GOWN DISPOSABLE)
GOWN STRL REUS W/TWL XL LVL3 (GOWN DISPOSABLE) ×4
HEMOSTAT POWDER KIT SURGIFOAM (HEMOSTASIS) ×1 IMPLANT
IMPL BASE TI 15D 10X38X28 (Neuro Prosthesis/Implant) IMPLANT
IMPLANT BASE TI 15D 10X38X28 (Neuro Prosthesis/Implant) ×2 IMPLANT
INSERT FOGARTY 61MM (MISCELLANEOUS) IMPLANT
INSERT FOGARTY SM (MISCELLANEOUS) IMPLANT
KIT BASIN OR (CUSTOM PROCEDURE TRAY) ×2 IMPLANT
KIT INFUSE XX SMALL 0.7CC (Orthopedic Implant) ×1 IMPLANT
KIT TURNOVER KIT B (KITS) ×2 IMPLANT
LOOP VESSEL MAXI BLUE (MISCELLANEOUS) IMPLANT
LOOP VESSEL MINI RED (MISCELLANEOUS) IMPLANT
NDL HYPO 25X1 1.5 SAFETY (NEEDLE) IMPLANT
NDL SPNL 18GX3.5 QUINCKE PK (NEEDLE) ×1 IMPLANT
NEEDLE HYPO 25X1 1.5 SAFETY (NEEDLE) ×2 IMPLANT
NEEDLE SPNL 18GX3.5 QUINCKE PK (NEEDLE) ×2 IMPLANT
NS IRRIG 1000ML POUR BTL (IV SOLUTION) ×2 IMPLANT
PACK LAMINECTOMY NEURO (CUSTOM PROCEDURE TRAY) ×2 IMPLANT
PAD ARMBOARD 7.5X6 YLW CONV (MISCELLANEOUS) ×4 IMPLANT
PUTTY BONE ATTRAX 10CC STRIP (Putty) ×1 IMPLANT
SPONGE INTESTINAL PEANUT (DISPOSABLE) ×6 IMPLANT
SPONGE LAP 18X18 X RAY DECT (DISPOSABLE) ×2 IMPLANT
SPONGE LAP 4X18 RFD (DISPOSABLE) ×1 IMPLANT
SPONGE SURGIFOAM ABS GEL SZ50 (HEMOSTASIS) ×1 IMPLANT
STAPLER VISISTAT 35W (STAPLE) IMPLANT
SUT PDS AB 1 CTX 36 (SUTURE) ×2 IMPLANT
SUT PROLENE 4 0 RB 1 (SUTURE)
SUT PROLENE 4-0 RB1 .5 CRCL 36 (SUTURE) IMPLANT
SUT PROLENE 5 0 CC1 (SUTURE) IMPLANT
SUT PROLENE 6 0 C 1 30 (SUTURE) ×2 IMPLANT
SUT PROLENE 6 0 CC (SUTURE) IMPLANT
SUT SILK 0 TIES 10X30 (SUTURE) ×2 IMPLANT
SUT SILK 2 0 TIES 10X30 (SUTURE) ×2 IMPLANT
SUT SILK 2 0 TIES 17X18 (SUTURE) ×2
SUT SILK 2 0SH CR/8 30 (SUTURE) IMPLANT
SUT SILK 2-0 18XBRD TIE BLK (SUTURE) ×1 IMPLANT
SUT SILK 3 0 TIES 10X30 (SUTURE) ×2 IMPLANT
SUT SILK 3 0SH CR/8 30 (SUTURE) IMPLANT
SUT VIC AB 1 CT1 18XBRD ANBCTR (SUTURE) IMPLANT
SUT VIC AB 1 CT1 8-18 (SUTURE)
SUT VIC AB 2-0 CT1 18 (SUTURE) ×2 IMPLANT
SUT VIC AB 2-0 CT1 36 (SUTURE) ×2 IMPLANT
SUT VIC AB 2-0 CTX 36 (SUTURE) IMPLANT
SUT VIC AB 3-0 SH 27 (SUTURE) ×2
SUT VIC AB 3-0 SH 27X BRD (SUTURE) ×1 IMPLANT
SUT VIC AB 3-0 SH 8-18 (SUTURE) ×1 IMPLANT
SUT VICRYL 4-0 PS2 18IN ABS (SUTURE) IMPLANT
TOWEL GREEN STERILE (TOWEL DISPOSABLE) ×4 IMPLANT
TOWEL GREEN STERILE FF (TOWEL DISPOSABLE) ×2 IMPLANT
TRAY FOLEY MTR SLVR 16FR STAT (SET/KITS/TRAYS/PACK) ×2 IMPLANT
WATER STERILE IRR 1000ML POUR (IV SOLUTION) ×2 IMPLANT

## 2018-12-26 NOTE — Progress Notes (Signed)
Awake, alert, conversant.  MAEW with good strength.  Doing well. 

## 2018-12-26 NOTE — Anesthesia Postprocedure Evaluation (Signed)
Anesthesia Post Note  Patient: Kendra Mann  Procedure(s) Performed: Lumbar 4-5 Anterior lumbar interbody fusion with Dr. Sherald Hess (N/A ) ABDOMINAL EXPOSURE (N/A )     Patient location during evaluation: PACU Anesthesia Type: General Level of consciousness: awake and alert Pain management: pain level controlled Vital Signs Assessment: post-procedure vital signs reviewed and stable Respiratory status: spontaneous breathing, nonlabored ventilation, respiratory function stable and patient connected to nasal cannula oxygen Cardiovascular status: blood pressure returned to baseline and stable Postop Assessment: no apparent nausea or vomiting Anesthetic complications: no    Last Vitals:  Vitals:   12/26/18 0538 12/26/18 1132  BP: 105/81 116/65  Pulse: 83 (!) 107  Resp:  12  Temp: 36.5 C (!) 36.4 C  SpO2: 98% 100%    Last Pain:  Vitals:   12/26/18 1132  PainSc: 10-Worst pain ever    LLE Motor Response: Purposeful movement;Responds to commands (12/26/18 1132) LLE Sensation: Full sensation (12/26/18 1132) RLE Motor Response: Purposeful movement;Responds to commands (12/26/18 1132) RLE Sensation: Full sensation (12/26/18 1132)      Delynn Pursley

## 2018-12-26 NOTE — Interval H&P Note (Signed)
History and Physical Interval Note:  12/26/2018 7:27 AM  Kendra Mann  has presented today for surgery, with the diagnosis of Lumbar spondylosis  The various methods of treatment have been discussed with the patient and family. After consideration of risks, benefits and other options for treatment, the patient has consented to  Procedure(s): Lumbar 4-5 Anterior lumbar interbody fusion with Dr. Sherald Hess (N/A) ABDOMINAL EXPOSURE (N/A) as a surgical intervention .  The patient's history has been reviewed, patient examined, no change in status, stable for surgery.  I have reviewed the patient's chart and labs.  Questions were answered to the patient's satisfaction.     [name]

## 2018-12-26 NOTE — Transfer of Care (Signed)
Immediate Anesthesia Transfer of Care Note  Patient: Kendra Mann  Procedure(s) Performed: Lumbar 4-5 Anterior lumbar interbody fusion with Dr. Sherald Hess (N/A ) ABDOMINAL EXPOSURE (N/A )  Patient Location: PACU  Anesthesia Type:General  Level of Consciousness: awake and patient cooperative  Airway & Oxygen Therapy: Patient Spontanous Breathing and Patient connected to face mask oxygen  Post-op Assessment: Report given to RN, Post -op Vital signs reviewed and stable and Patient moving all extremities X 4  Post vital signs: Reviewed and stable  Last Vitals:  Vitals Value Taken Time  BP 116/65 12/26/2018 11:32 AM  Temp    Pulse 101 12/26/2018 11:32 AM  Resp 10 12/26/2018 11:32 AM  SpO2 100 % 12/26/2018 11:32 AM  Vitals shown include unvalidated device data.  Last Pain:  Vitals:   12/26/18 0618  PainSc: 6       Patients Stated Pain Goal: 4 (12/26/18 0618)  Complications: No apparent anesthesia complications

## 2018-12-26 NOTE — Interval H&P Note (Signed)
History and Physical Interval Note:  12/26/2018 7:28 AM  Kendra Mann  has presented today for surgery, with the diagnosis of Lumbar spondylosis  The various methods of treatment have been discussed with the patient and family. After consideration of risks, benefits and other options for treatment, the patient has consented to  Procedure(s): Lumbar 4-5 Anterior lumbar interbody fusion with Dr. Sherald Hess (N/A) ABDOMINAL EXPOSURE (N/A) as a surgical intervention .  The patient's history has been reviewed, patient examined, no change in status, stable for surgery.  I have reviewed the patient's chart and labs.  Questions were answered to the patient's satisfaction.     [name]

## 2018-12-26 NOTE — Anesthesia Procedure Notes (Signed)
Procedure Name: Intubation Date/Time: 12/26/2018 7:41 AM Performed by: Julian ReilWelty, Vedika Dumlao F, CRNA Pre-anesthesia Checklist: Patient identified, Emergency Drugs available, Suction available and Patient being monitored Patient Re-evaluated:Patient Re-evaluated prior to induction Oxygen Delivery Method: Circle system utilized Preoxygenation: Pre-oxygenation with 100% oxygen Induction Type: IV induction, Cricoid Pressure applied and Rapid sequence Ventilation: Mask ventilation without difficulty Laryngoscope Size: Miller and 3 Grade View: Grade I Tube type: Oral Tube size: 7.0 mm Number of attempts: 1 Airway Equipment and Method: Stylet Placement Confirmation: ETT inserted through vocal cords under direct vision,  positive ETCO2 and breath sounds checked- equal and bilateral Secured at: 22 cm Tube secured with: Tape Dental Injury: Teeth and Oropharynx as per pre-operative assessment  Comments: C/O nausea in Short-Stay, proceeded with RSI. 4x4s bite block used.

## 2018-12-26 NOTE — Evaluation (Signed)
Occupational Therapy Evaluation Patient Details Name: Kendra Mann MRN: [id number] DOB: [birth year] Today's Date: 12/26/2018    History of Present Illness Patient is a 53 y/o female s/p Lumbar 4-5 Anterior lumbar interbody fusion.   Clinical Impression   This 53 yo female admitted with above presents to acute OT with decreased movement of LLE, increased pain LLE, numbness LLE, pain also in back and abdomen, decreased mobility, and back precautions all affecting her PLOF of being independent with basic ADLs. She will benefit from acute OT with follow up HHOT.    Follow Up Recommendations  Home health OT;Supervision/Assistance - 24 hour    Equipment Recommendations  3 in 1 bedside commode       Precautions / Restrictions Precautions Precautions: Fall;Back Precaution Booklet Issued: Yes (comment) Precaution Comments: reviewed back precautions Required Braces or Orthoses: Spinal Brace Spinal Brace: Lumbar corset;Applied in sitting position Restrictions Weight Bearing Restrictions: No      Mobility Bed Mobility Overal bed mobility: Needs Assistance Bed Mobility: Rolling;Sidelying to Sit Rolling: Mod assist Sidelying to sit: Mod assist;HOB elevated       General bed mobility comments: Cues for log roll technique, assist with LLE and to elevate trunk to get to EOB. Increased time.  Transfers Overall transfer level: Needs assistance Equipment used: Rolling walker (2 wheeled) Transfers: Sit to/from Stand Sit to Stand: Min assist         General transfer comment: Assist to power to standing and for steadying balance.    Balance Overall balance assessment: Needs assistance Sitting-balance support: Feet supported;Bilateral upper extremity supported Sitting balance-Leahy Scale: Poor Sitting balance - Comments: Requires BUe support sitting due to increased pain; assist to donn LSO   Standing balance support: During functional activity Standing balance-Leahy Scale:  Fair Standing balance comment: Able to stand statically without UE support but posteriorn lean and min A needed for balance.                            ADL either performed or assessed with clinical judgement   ADL Overall ADL's : Needs assistance/impaired Eating/Feeding: Independent;Sitting   Grooming: Sitting;Min guard   Upper Body Bathing: Min guard;Sitting   Lower Body Bathing: Moderate assistance Lower Body Bathing Details (indicate cue type and reason): min A sit<>stand Upper Body Dressing : Min guard;Sitting   Lower Body Dressing: Maximal assistance Lower Body Dressing Details (indicate cue type and reason): min A sit<>stand Toilet Transfer: Minimal assistance;Ambulation;RW Toilet Transfer Details (indicate cue type and reason): Bed>out into hallway>back into room to recliner Toileting- Clothing Manipulation and Hygiene: Moderate assistance Toileting - Clothing Manipulation Details (indicate cue type and reason): min A sit<>stand             Vision Patient Visual Report: No change from baseline              Pertinent Vitals/Pain Pain Assessment: Faces Faces Pain Scale: Hurts whole lot Pain Location: LLE, back, abdomen Pain Descriptors / Indicators: Operative site guarding;Sharp;Shooting Pain Intervention(s): Monitored during session;Repositioned;Premedicated before session;Ice applied;Limited activity within patient's tolerance     Hand Dominance Right   Extremity/Trunk Assessment Upper Extremity Assessment Upper Extremity Assessment: Overall WFL for tasks assessed     Communication Communication Communication: No difficulties   Cognition Arousal/Alertness: Awake/alert Behavior During Therapy: WFL for tasks assessed/performed Overall Cognitive Status: Within Functional Limits for tasks assessed  Home Living Family/patient expects to be discharged to:: Private  residence Living Arrangements: Alone Available Help at Discharge: Friend(s);Available PRN/intermittently Type of Home: House Home Access: Stairs to enter [employer] of Steps: 1   Home Layout: One level     Bathroom Shower/Tub: IT trainer: Standard     Home Equipment: None          Prior Functioning/Environment Level of Independence: Independent        Comments: Works as an Airline pilot for [employer], 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        OT Problem List: Decreased strength;Decreased range of motion;Impaired balance (sitting and/or standing);Pain;Obesity;Decreased knowledge of use of DME or AE;Decreased knowledge of precautions      OT Treatment/Interventions: Self-care/ADL training;Balance training;DME and/or AE instruction;Patient/family education    OT Goals(Current goals can be found in the care plan section) Acute Rehab OT Goals Patient Stated Goal: to make this left leg pain better and be able to move my leg normally again OT Goal Formulation: With patient Time For Goal Achievement: 01/09/19 Potential to Achieve Goals: Good  OT Frequency: Min 2X/week   Barriers to D/C: Decreased caregiver support          Co-evaluation PT/OT/SLP Co-Evaluation/Treatment: Yes(partial due to pt intially having a hard to with mobility)     OT goals addressed during session: Strengthening/ROM;ADL's and self-care      AM-PAC OT "6 Clicks" Daily Activity     Outcome Measure Help from another person eating meals?: None Help from another person taking care of personal grooming?: A Little Help from another person toileting, which includes using toliet, bedpan, or urinal?: A Lot Help from another person bathing (including washing, rinsing, drying)?: A Lot Help from another person to put on and taking off regular upper body clothing?: A Little Help from another person to put on and taking off regular lower body  clothing?: A Lot 6 Click Score: 16   End of Session Equipment Utilized During Treatment: Gait belt Nurse Communication: (pt reports her LLE is numb and she is not able to move it as she was prior to surgery)  Activity Tolerance: Patient limited by pain Patient left: (with PT walking)  OT Visit Diagnosis: Unsteadiness on feet (R26.81);Other abnormalities of gait and mobility (R26.89);Pain Pain - part of body: (abdomen, back, LLE)                Time: 1610-9604 OT Time Calculation (min): 30 min Charges:  OT General Charges $OT Visit: 1 Visit OT Evaluation $OT Eval Moderate Complexity: 1 Mod  Ignacia Palma, OTR/L Acute [employer] Pager [phone] Office [phone]     Evette Georges 12/26/2018, 5:05 PM

## 2018-12-26 NOTE — Op Note (Signed)
Date: December 26, 2018  Preoperative diagnosis: Back pain  Postoperative diagnosis: Same  Procedure: Left retroperitoneal anterior spine exposure at L4-L5 disc space  Surgeon: Dr. [name], MD  Co-Surgeon: Dr. Maeola Harman, MD  Indications: Patient is a 53 year old female who was recently seen in clinic as a referral from neurosurgery for evaluation of a planned L4-L5 ALIF.  Patient has a longstanding history of back pain and has failed conservative measures.  She presents today for planned anterior spine exposure and L4-L5 disc space after risks and benefits were discussed.  Findings: Left paramedian incision with mobilization of the left rectus muscle, peritoneum, left ureter, as well as mobilization of the left iliac artery and iliac vein.  This required ligation of two left iliolumbar branches.  Ultimately the L4-L5 disc space was exposed and confirmed with lateral fluoroscopy to confirm we were at the correct disc space.  Anesthesia: General  Complications: None  Details: Patient was taken to the operating room after informed consent was obtained.  She was placed on operative table in supine position.  Initially used fluoroscopic C arm in lateral position to confirm the L4-L5 to space and this was marked on the abdominal wall.  Ultimately after the abdominal wall was prepped and draped in sterile fashion a timeout was performed to identify patient, procedure and site.  Initially made a paramedian incision over the middle of the left rectus over the L4-L5 to space.  Carried our dissection through the subcutaneous tissue to the anterior sheath with Bovie cautery.  Large cerebellum retractors were placed for added exposure.  The anterior rectus sheath was then opened in longitudinal fashion with Bovie cautery.  Then used hemostats to pull up on the anterior rectus sheath and mobilized anterior to the rectus muscle laterally and then posterior to the rectus muscle.  Ultimately the  arcuate line was identified and peritoneum was mobilized off the posterior sheath.  I had one small hole in the peritoneum that was repaired with 3-0 Vicryl. The posterior sheath was then opened in longitudinal fashion cranially with Metzenbaum scissors.  I then used a Kd and dissected all the peritoneum contents across the midline medially and identified the retroperitoneum.  Ultimately identified the left psoas as well as the left iliac artery and iliac vein.  At that point in time I then placed a Balfour retractor in a craniocaudal direction and then using a Kd dissector and suction and mobilized the left iliac artery medially across the midline of the spine and identified the left iliac vein.  Two iliolumbar branches were identified and ligated between 2-0 silk ties and vessel clips and transected.  I did in the process of mobilizing find one segmental branch that was also transected between ties and clips.  Ultimately the left iliac vein and artery was mobilized completely across midline to expose the L4-L5 vertebral bodies and disc space.  At that point in time we then placed a fixed retractor and used 150 reversel lip retractors medially and laterally with 200 malleable retractors cephalad and caudad to fully expose the disc base.  Initially while doing this we did lose pulse oximetry reading in the left foot.  As a result I then repositioned my retractor higher on the iliac artery until we had a pulse ox signal back.  We then obtained a lateral fluoroscopic image with a needle in the disc space and confirmed we were in the L4-L5 disc space.  At this point in time the case was turned over  to Dr. Venetia Maxon.  Please see his dictation for the remainder of the case.  I did go down at the end of the case and checked the left foot and the patient had a palpable pulse once all the retractors were removed.  Condition: Stable  [name], MD [facility] Office:  [phone] Pager: [phone]  [name]

## 2018-12-26 NOTE — H&P (Signed)
Patient ID:   [id number] Patient: Kendra Mann  Date of Birth: 09/26/1966 Visit Type: Office Visit   Date: 12/24/2018 11:45 AM Provider: Danae Orleans. Venetia Maxon MD   This 54 year old female presents for back pain.  HISTORY OF PRESENT ILLNESS:  1.  back pain  Kendra Mann, 53 year old female, patient of Dr. Sueanne Margarita, visits to discuss her planned AL IF for this Friday.  She reports longstanding back pain, no longer responsive to epidural injections and radiofrequency ablation. Patient endorses that her pain is so severe that she could vomit.  With this visit, she notes lumbar left buttock, and left hamstring pain. However, patient endorses that her back pain is the most severe.  Unfortunately, she has been off all pain medications since last Friday in preparation for her upcoming surgery.  Norco 5/325 1-2-3/day by her PCP Ibuprofen 800 milligrams t.i.d.   L4-5 ALIF planned Dr. Chestine Spore has consulted for vascular surgery  09/23/2018 lumbar ESI by Dr. Murray Hodgkins offered no lasting relief  History:  Healthy Surgical history:  Hysterectomy and exploratory lap  Lumbar MRI and CT on [facility] X-ray on [facility]  09/05/18 lumbar CT myelogram 1. Degenerative facet spurring at L2-3 and below, most prominent at L4-5. No listhesis. 2. At CT myelogram of canal is diffusely patent. When standing and with extension bulge and ligamentous buckling causes moderate canal narrowing at L3-4. 3. No compressive foraminal stenosis.  02/24/18 L-spine MRI without contrast 1. Moderate to advanced bilateral facet hypertrophy at L3-4 and L4-5 bilaterally, and at L5-S1 on the right. Associated mild reactive edema about the right L4-5 facet due to facet arthritis. Finding could serve as a source for lower back pain. 2. Minor degenerative disc disease without significant canal stenosis 3. Mild bilateral L3 and L4 foraminal narrowing, largely due to facet disease.         Medical/Surgical/Interim History Reviewed, no  change.  Last detailed document date:10/14/2018.     Family History:  Reviewed, no changes.  Last detailed document date:08/19/2018.   Social History: Reviewed, no changes. Last detailed document date: 10/14/2018.    MEDICATIONS: (added, continued or stopped this visit) Started Medication Directions Instruction Stopped  12/24/2018 Percocet 5 mg-325 mg tablet take 1-2 tablets by oral route  every 4 hours as needed for pain       ALLERGIES: Ingredient Reaction Medication Name Comment  NO KNOWN ALLERGIES     No known allergies.    PHYSICAL EXAM:   Vitals Date Temp F BP Pulse Ht In Wt Lb BMI BSA Pain Score  12/24/2018  135/72 65 66 260 41.96  8/10      IMPRESSION:   4-view L-spine X-rays reveal, no instability, no spondylolisthesis,   Upon examination, full strength BLE.  Reviewed prior L-spine MRI and lumbar CT myelogram. Patient will continue with previously scheduled L4-5 ALIF. Discussed medication options for pain - patient endorses that previous Norco use did not offer significant relief - Rx 5/325 percocet. Patient will begin percocet now and continue with percocet after surgery. Discussed methodology of surgery at length with patient using a model as an aid. Advised patient to expect at least 4-6 weeks of recovery. Patient will return for post-op appointment after surgery is performed.  PLAN:  1. LSO brace fitted 2. Nurse education given 3. Rx 5/325 Percocet 4. L4-5 ALIF scheduled 5. Follow-up after surgery  Orders: Diagnostic Procedures: Assessment Procedure  M54.5 Lumbar Spine- AP/Lat  M54.5 Lumbar Spine- AP/Lat/Flex/Ex  Miscellaneous: Assessment   M54.5 Aspen Lo Sag Rigid  Panel Quick   Completed Orders (this encounter) Order Details Reason Side Interpretation Result Initial Treatment Date Region  Lumbar Spine- AP/Lat/Flex/Ex      12/24/2018 All Levels to All Levels   Assessment/Plan   # Detail Type Description   1. Assessment Lumbar pain (M54.5).    Plan Orders Aspen Lo Sag Rigid Panel Quick.       2. Assessment Lumbar spondylosis (M47.816).            MEDICATIONS PRESCRIBED TODAY    Rx Quantity Refills  PERCOCET 5 mg-325 mg  60 0            Provider:  Danae Orleans. Venetia Maxon MD  12/24/2018 10:03 PM Dictation edited by: Briscoe Burns    CC Providers: Darryl Lent [facility] [place],  [place]  [id number]-   Darryl Lent  [facility] [place], [place] [address]-               Electronically signed by Danae Orleans. Venetia Maxon MD on 12/26/2018 07:23 AM

## 2018-12-26 NOTE — Op Note (Signed)
12/26/2018  11:30 AM  PATIENT:  Kendra Mann  52 y.o. female  PRE-OPERATIVE DIAGNOSIS:  Lumbar spondylosis, lumbar spondylolisthesis, radiculopathy, lumbago  POST-OPERATIVE DIAGNOSIS:  Lumbar spondylosis, lumbar spondylolisthesis, radiculopathy, lumbago  PROCEDURE:  Procedure(s): Lumbar 4-5 Anterior lumbar interbody fusion with Dr. Christopher Clark (N/A) ABDOMINAL EXPOSURE (N/A) ALIF L 45 with titanium cage, allograft, screw fixation  SURGEON:  Surgeon(s) and Role: Panel 1:    * Eddison Searls, MD - Primary Panel 2:    * Clark, Christopher J, MD - Primary  PHYSICIAN ASSISTANT:   ASSISTANTS: Poteat, RN   ANESTHESIA:   general  EBL:  100 mL   BLOOD ADMINISTERED:none  DRAINS: none   LOCAL MEDICATIONS USED:  MARCAINE     SPECIMEN:  No Specimen  DISPOSITION OF SPECIMEN:  N/A  COUNTS:  YES  TOURNIQUET:  * No tourniquets in log *  DICTATION: INDICATIONS:  Pateint is 52 year old female with chronic and intractable back and bilateral lower extremity pain with  disc degeneration, spondylosis, spodylolisthesis at the L 4 5 level.  Shehas failed to improve despite extensive and prolonged conservative therapy.  It was elected to take her to surgery for anterior lumbar decompression and fusion at the L 4 5 level.  PROCEDURE:  Doctor Carter performed exposure and his portion of the procedure will be dictated separately.  Upon exposing the L 4 5 level, a localizing X ray was obtained with the C arm.  I then incised the anterior annulus and performed a thorough discectomy.  The endplates were cleared of disc and cartilagenous material and a thorough discectomy was performed with decompression of the ventral annulus and disc material.  After trial, a 15 degree lordotic x 10 x 38 x 28  mm Nuvasive titanium spacer was selected, packed with extra extra small BMP and Attrax.  The implant was tamped into position and positioning was confirmed with C arm.  The implanted cage was then affixed to  the L 4 and L 5 vertebrae using 5.0 x 20 mm interfixated screws, two at L 4 and one at L 5.  Locking mechanisms were engaged, soft tissues were inspected and found to be in good repair.  Retractors were removed.  Fascia was closed with 1 PDS running stitch, skin edges closed with 2-0 and 3-0 vicryl sutures.  Wound was dressed with Dermabond and a sterile occlusive dressing.  Patient was extubated in the OR and taken to recovery having tolerated her surgery well.  Counts were correct.   PLAN OF CARE: Admit to inpatient   PATIENT DISPOSITION:  PACU - hemodynamically stable.   Delay start of Pharmacological VTE agent (>24hrs) due to surgical blood loss or risk of bleeding: yes  

## 2018-12-26 NOTE — Evaluation (Signed)
Physical Therapy Evaluation Patient Details Name: Kendra Mann MRN: [id number] DOB: Feb 12, 1966 Today's Date: 12/26/2018   History of Present Illness  Patient is a 53 y/o female s/p Lumbar 4-5 Anterior lumbar interbody fusion.  Clinical Impression  Patient presents with pain, new LLE weakenss/numbness and post surgical deficits s/p above surgery. Tolerated bed mobility, transfers and gait training with min-Mod A for balance/safety. Will need RW for ambulation at home. Pt lives alone and independent PTA. Education re: back precautions, positioning, exercise, brace etc. Will follow acutely to maximize independence and mobility prior to return home.    Follow Up Recommendations Home health PT;Supervision for mobility/OOB    Equipment Recommendations  Rolling walker with 5" wheels;3in1 (PT)    Recommendations for Other Services       Precautions / Restrictions Precautions Precautions: Fall;Back Precaution Booklet Issued: Yes (comment) Precaution Comments: reviewed back precautions Required Braces or Orthoses: Spinal Brace Spinal Brace: Lumbar corset;Applied in sitting position Restrictions Weight Bearing Restrictions: No      Mobility  Bed Mobility Overal bed mobility: Needs Assistance Bed Mobility: Rolling;Sidelying to Sit Rolling: Mod assist Sidelying to sit: Mod assist;HOB elevated       General bed mobility comments: Cues for log roll technique, assist with LLE and to elevate trunk to get to EOB. Increased time.  Transfers Overall transfer level: Needs assistance Equipment used: Rolling walker (2 wheeled) Transfers: Sit to/from Stand Sit to Stand: Min assist         General transfer comment: Assist to power to standing and for steadying balance.  Ambulation/Gait Ambulation/Gait assistance: Min assist;Min guard Gait Distance (Feet): 45 Feet Assistive device: Rolling walker (2 wheeled) Gait Pattern/deviations: Step-through pattern;Decreased stance time -  left;Decreased step length - right;Leaning posteriorly Gait velocity: decreased Gait velocity interpretation: <1.8 ft/sec, indicate of risk for recurrent falls General Gait Details: Slow, mildly unsteady gait with left knee instability. Increased WB through BUEs. Posterior lean when static standing during breaks.  Stairs            Wheelchair Mobility    Modified Rankin (Stroke Patients Only)       Balance Overall balance assessment: Needs assistance Sitting-balance support: Feet supported;Bilateral upper extremity supported Sitting balance-Leahy Scale: Poor Sitting balance - Comments: Requires BUe support sitting due to increased pain; assist to donn LSO   Standing balance support: During functional activity Standing balance-Leahy Scale: Fair Standing balance comment: Able to stand statically without UE support but posteriorn lean and min A needed for balance.                              Pertinent Vitals/Pain Pain Assessment: Faces Faces Pain Scale: Hurts whole lot Pain Location: LLE, back, abdomen Pain Descriptors / Indicators: Operative site guarding;Sharp;Shooting Pain Intervention(s): Monitored during session;Repositioned;Premedicated before session;Limited activity within patient's tolerance;Ice applied    Home Living Family/patient expects to be discharged to:: Private residence Living Arrangements: Alone Available Help at Discharge: Friend(s);Available PRN/intermittently Type of Home: House Home Access: Stairs to enter   Entrance Stairs-Number of Steps: 1 Home Layout: One level Home Equipment: None      Prior Function Level of Independence: Independent         Comments: Works as an Airline pilot for [employer], 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     Hand Dominance        Extremity/Trunk Assessment   Upper Extremity Assessment Upper Extremity Assessment: Defer to OT  evaluation    Lower Extremity  Assessment Lower Extremity Assessment: LLE deficits/detail LLE Deficits / Details: Grossly ~2+/5 knee extension, 1/5 hip flexion, 2/5 knee flexion. LLE Sensation: decreased light touch(anterior thigh)    Cervical / Trunk Assessment Cervical / Trunk Assessment: Other exceptions Cervical / Trunk Exceptions: s/p spine surgery  Communication   Communication: No difficulties  Cognition Arousal/Alertness: Awake/alert Behavior During Therapy: WFL for tasks assessed/performed Overall Cognitive Status: Within Functional Limits for tasks assessed                                        General Comments      Exercises     Assessment/Plan    PT Assessment Patient needs continued PT services  PT Problem List Decreased strength;Decreased balance;Pain;Decreased mobility;Decreased skin integrity;Obesity       PT Treatment Interventions DME instruction;Functional mobility training;Balance training;Patient/family education;Gait training;Therapeutic activities;Therapeutic exercise;Stair training    PT Goals (Current goals can be found in the Care Plan section)  Acute Rehab PT Goals Patient Stated Goal: to make this left leg pain better PT Goal Formulation: With patient Time For Goal Achievement: 01/09/19 Potential to Achieve Goals: Good    Frequency Min 5X/week   Barriers to discharge Decreased caregiver support lives alone    Co-evaluation               AM-PAC PT "6 Clicks" Mobility  Outcome Measure Help needed turning from your back to your side while in a flat bed without using bedrails?: A Lot Help needed moving from lying on your back to sitting on the side of a flat bed without using bedrails?: A Lot Help needed moving to and from a bed to a chair (including a wheelchair)?: A Little Help needed standing up from a chair using your arms (e.g., wheelchair or bedside chair)?: A Little Help needed to walk in hospital room?: A Little Help needed climbing 3-5  steps with a railing? : A Lot 6 Click Score: 15    End of Session Equipment Utilized During Treatment: Back brace;Gait belt Activity Tolerance: Patient tolerated treatment well;Patient limited by pain Patient left: in chair;with call bell/phone within reach Nurse Communication: Mobility status PT Visit Diagnosis: Pain;Difficulty in walking, not elsewhere classified (R26.2);Muscle weakness (generalized) (M62.81) Pain - Right/Left: Left Pain - part of body: Hip;Leg    Time: 1610-9604 PT Time Calculation (min) (ACUTE ONLY): 23 min   Charges:   PT Evaluation $PT Eval Moderate Complexity: 1 Mod          Mylo Red, PT, DPT Acute Rehabilitation Services Pager [phone] Office [phone]      Blake Divine A Lanier Ensign 12/26/2018, 4:19 PM

## 2018-12-26 NOTE — H&P (Signed)
History and Physical Interval Note:  12/26/2018 7:15 AM  Kendra Mann  has presented today for surgery, with the diagnosis of Lumbar spondylosis  The various methods of treatment have been discussed with the patient and family. After consideration of risks, benefits and other options for treatment, the patient has consented to  Procedure(s): Lumbar 4-5 Anterior lumbar interbody fusion with Dr. Sherald Hess (N/A) ABDOMINAL EXPOSURE (N/A) as a surgical intervention .  The patient's history has been reviewed, patient examined, no change in status, stable for surgery.  I have reviewed the patient's chart and labs.  Questions were answered to the patient's satisfaction.    Anterior spine exposure for L4-L5 with Dr. Venetia Maxon.  [name]  Patient name: Kendra Mann           MRN: [id number]        DOB: [birth year]          Sex: female  REASON FOR CONSULT: New evaluation prior to L4-L5 ALIF  HPI: Kendra Mann is a 53 y.o. female, with history of morbid obesity that presents for preop evaluation for L4-L5 AlLIF with Dr. Franky Macho.  Patient states she has had pretty profound lower back pain that has limited her mobility.  As a result she has gained over 100 pounds.  She has been evaluated by Dr. Franky Macho and he has recommended an L4-L5 anterior fusion next Friday.  Patient denies any previous abdominal surgeries other than a laparoscopic procedure through her bellybutton.  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  She also had a transvaginal hysterectomy.  No blood thinners.        Past Medical History:  Diagnosis Date  . Back pain   . Lumbar disc disease with radiculopathy   . Lumbar spondylosis   . Plantar fasciitis    PSH: Laparoscopic procedure through belly button, transvaginal hysterectomy  FH: No hx of bleeding disorders or issues with anesthesia   SOCIAL HISTORY: Social History        Socioeconomic History  . Marital status: Single    Spouse name: Not on  file  . Number of children: Not on file  . Years of education: Not on file  . Highest education level: Not on file  Occupational History  . Not on file  Social Needs  . Financial resource strain: Not on file  . Food insecurity:    Worry: Not on file    Inability: Not on file  . Transportation needs:    Medical: Not on file    Non-medical: Not on file  Tobacco Use  . Smoking status: Never Smoker  . Smokeless tobacco: Never Used  Substance and Sexual Activity  . Alcohol use: Yes    Comment: occasionally  . Drug use: Never  . Sexual activity: Not on file  Lifestyle  . Physical activity:    Days per week: Not on file    Minutes per session: Not on file  . Stress: Not on file  Relationships  . Social connections:    Talks on phone: Not on file    Gets together: Not on file    Attends religious service: Not on file    Active member of club or organization: Not on file    Attends meetings of clubs or organizations: Not on file    Relationship status: Not on file  . Intimate partner violence:    Fear of current or ex partner: Not on file    Emotionally  abused: Not on file    Physically abused: Not on file    Forced sexual activity: Not on file  Other Topics Concern  . Not on file  Social History Narrative  . Not on file    No Known Allergies        Current Outpatient Medications  Medication Sig Dispense Refill  . conjugated estrogens (PREMARIN) vaginal cream Place 0.5 Applicatorfuls vaginally 2 (two) times a week.    Marland Kitchen HYDROcodone-acetaminophen (NORCO) 7.5-325 MG tablet Take 1 tablet by mouth every 4 (four) hours as needed for moderate pain or severe pain.     Marland Kitchen ibuprofen (ADVIL,MOTRIN) 200 MG tablet Take 800 mg by mouth every 6 (six) hours as needed for headache or moderate pain.      No current facility-administered medications for this visit.             Facility-Administered Medications Ordered in Other Visits   Medication Dose Route Frequency Provider Last Rate Last Dose  . ondansetron (ZOFRAN) 4 mg in sodium chloride 0.9 % 50 mL IVPB  4 mg Intravenous Q6H PRN Coletta Memos, MD        REVIEW OF SYSTEMS:  [X]  denotes positive finding, [ ]  denotes negative finding Cardiac  Comments:  Chest pain or chest pressure:    Shortness of breath upon exertion:    Short of breath when lying flat:    Irregular heart rhythm:        Vascular    Pain in calf, thigh, or hip brought on by ambulation:    Pain in feet at night that wakes you up from your sleep:     Blood clot in your veins:    Leg swelling:         Pulmonary    Oxygen at home:    Productive cough:     Wheezing:         Neurologic    Sudden weakness in arms or legs:     Sudden numbness in arms or legs:     Sudden onset of difficulty speaking or slurred speech:    Temporary loss of vision in one eye:     Problems with dizziness:         Gastrointestinal    Blood in stool:     Vomited blood:         Genitourinary    Burning when urinating:     Blood in urine:        Psychiatric    Major depression:         Hematologic    Bleeding problems:    Problems with blood clotting too easily:        Skin    Rashes or ulcers:        Constitutional    Fever or chills:      PHYSICAL EXAM:    Vitals:   12/16/18 1111  BP: (!) 137/95  Pulse: 76  Resp: 20  Temp: (!) 97 F (36.1 C)  TempSrc: Oral  SpO2: 100%  Weight: 252 lb (114.3 kg)  Height: 5\' 6"  (1.676 m)    GENERAL: The patient is a well-nourished female, in no acute distress. The vital signs are documented above. CARDIAC: There is a regular rate and rhythm.  VASCULAR:  2+ femoral pulse palpable bilateral groins 2+ DP palpable bilateral lower extremities PULMONARY: There is good air exchange bilaterally without wheezing or rales. ABDOMEN: Soft and non-tender.  Obese.    MUSCULOSKELETAL: There are no  major deformities or cyanosis. NEUROLOGIC: No focal weakness or paresthesias are detected. SKIN: There are no ulcers or rashes noted. PSYCHIATRIC: The patient has a normal affect.  DATA:   I reviewed her CT lumbar spine and it appears that her aortic bifurcation is at the level of L4-L5.  No significant atherosclerotic disease apparent.  Without contrast difficult to see iliolumbar veins.  Assessment/Plan:  53 year old female scheduled for a L4-L5 ALIF with Dr. Franky Macho next Friday.  Discussed details of surgery with the patient today including plans for left paramedian incision with mobilization of the peritoneum, ureter, artery and veins to visualize disc space.  We discussed risks of the procedure including bowel injury, ureter injury, injury to arteries or veins with bleeding, etc.  Patient understands and wishes to proceed.  Discussed with her that this would be more difficult given her morbid obesity.  All questions answered and look forward to assisting Dr. Franky Macho.     [name], MD [facility] Office: [phone] Pager: [phone]   [name]

## 2018-12-26 NOTE — Brief Op Note (Signed)
12/26/2018  11:30 AM  PATIENT:  Kendra Mann  53 y.o. female  PRE-OPERATIVE DIAGNOSIS:  Lumbar spondylosis, lumbar spondylolisthesis, radiculopathy, lumbago  POST-OPERATIVE DIAGNOSIS:  Lumbar spondylosis, lumbar spondylolisthesis, radiculopathy, lumbago  PROCEDURE:  Procedure(s): Lumbar 4-5 Anterior lumbar interbody fusion with Dr. Sherald Hess (N/A) ABDOMINAL EXPOSURE (N/A) ALIF L 45 with titanium cage, allograft, screw fixation  SURGEON:  Surgeon(s) and Role: Panel 1:    Maeola Harman, MD - Primary Panel 2:    * [name], MD - Primary  PHYSICIAN ASSISTANT:   ASSISTANTS: Poteat, RN   ANESTHESIA:   general  EBL:  100 mL   BLOOD ADMINISTERED:none  DRAINS: none   LOCAL MEDICATIONS USED:  MARCAINE     SPECIMEN:  No Specimen  DISPOSITION OF SPECIMEN:  N/A  COUNTS:  YES  TOURNIQUET:  * No tourniquets in log *  DICTATION: INDICATIONS:  Pateint is 53 year old female with chronic and intractable back and bilateral lower extremity pain with  disc degeneration, spondylosis, spodylolisthesis at the L 4 5 level.  Shehas failed to improve despite extensive and prolonged conservative therapy.  It was elected to take her to surgery for anterior lumbar decompression and fusion at the L 4 5 level.  PROCEDURE:  Doctor Montez Morita performed exposure and his portion of the procedure will be dictated separately.  Upon exposing the L 4 5 level, a localizing X ray was obtained with the C arm.  I then incised the anterior annulus and performed a thorough discectomy.  The endplates were cleared of disc and cartilagenous material and a thorough discectomy was performed with decompression of the ventral annulus and disc material.  After trial, a 15 degree lordotic x 10 x 38 x 28  mm Nuvasive titanium spacer was selected, packed with extra extra small BMP and Attrax.  The implant was tamped into position and positioning was confirmed with C arm.  The implanted cage was then affixed to  the L 4 and L 5 vertebrae using 5.0 x 20 mm interfixated screws, two at L 4 and one at L 5.  Locking mechanisms were engaged, soft tissues were inspected and found to be in good repair.  Retractors were removed.  Fascia was closed with 1 PDS running stitch, skin edges closed with 2-0 and 3-0 vicryl sutures.  Wound was dressed with Dermabond and a sterile occlusive dressing.  Patient was extubated in the OR and taken to recovery having tolerated her surgery well.  Counts were correct.   PLAN OF CARE: Admit to inpatient   PATIENT DISPOSITION:  PACU - hemodynamically stable.   Delay start of Pharmacological VTE agent (>24hrs) due to surgical blood loss or risk of bleeding: yes

## 2018-12-27 ENCOUNTER — Inpatient Hospital Stay (HOSPITAL_COMMUNITY): Payer: BLUE CROSS/BLUE SHIELD

## 2018-12-27 MED ORDER — DOCUSATE SODIUM 100 MG PO CAPS: 100.0000 mg | ORAL_CAPSULE | Freq: Two times a day (BID) | ORAL | 0 refills | Status: DC

## 2018-12-27 MED ORDER — METHOCARBAMOL 500 MG PO TABS: 500.0000 mg | ORAL_TABLET | Freq: Four times a day (QID) | ORAL | 2 refills | Status: AC | PRN

## 2018-12-27 MED ORDER — OXYCODONE HCL 5 MG PO TABS: 5.0000 mg | ORAL_TABLET | ORAL | 0 refills | Status: DC | PRN

## 2018-12-27 NOTE — Discharge Summary (Signed)
Discharge Summary  Date of Admission: 12/26/2018  Date of Discharge: 12/27/18  Attending Physician: Maeola Harman, MD  Hospital Course: Patient was admitted following an uncomplicated L4-5 ALIF. She was recovered in PACU and transferred to the spine recovery center. On the evening of surgery, she noticed that she was having some weakness of her left hip flexors along with some numbness in the left L2 distribution. A CT of the lumbar spine was obtained that showed hardware in good position without any new pathology along the L2 nerve root. The nature of the weakness was most likely to be neurapraxia in the lumbar plexus related to retraction during surgery. Her hospital course was uncomplicated, she was able to ambulate with PT and OT with use of accessory muscles. She was therefore discharged home on 12/27/2018. She will follow up in clinic with Dr. Venetia Maxon.  Neurologic exam at discharge:  AOx3, PERRL, EOMI, FS, TM Strength 5/5 x4 except for 1/5 at left hip flexor, SILTx4 except for left L2 distribution numbness  Jadene Pierini, MD 12/27/18 9:28 AM

## 2018-12-27 NOTE — Progress Notes (Signed)
The patient is downstairs getting a CT scan because of numbness in her left leg.  Per the nursing report, she has palpable pedal pulses and no significant abdominal pain.  Durene Cal

## 2018-12-27 NOTE — Care Management Note (Signed)
Case Management Note  Patient Details  Name: Kendra Mann MRN: [id number] Date of Birth: 05-17-1966  Subjective/Objective:            [facility].  Pt will be home alone and unable to get to OP therapy.  Pt experiencing weakness and PT/OT HH recommended.          Action/Plan[facility], and [facility] who were unable to accept.  [facility] and will contact patient Monday to set up appointments.   Expected Discharge Date:  12/27/18               Expected Discharge Plan:  Home w Home Health Services  In-House Referral:  NA  Discharge planning Services  CM Consult  Post Acute Care Choice:  Home Health, Durable Medical Equipment Choice offered to:  Patient  DME Arranged:  3-N-1, Walker rolling DME Agency:  [facility]  [facility]  [facility]  Status of Service:  Completed, signed off  If discussed at Long Length of Stay Meetings, dates discussed:    Additional Comments:  Deveron Furlongshley  Daniya Aramburo, RN 12/27/2018, 11:24 AM

## 2018-12-27 NOTE — Progress Notes (Signed)
Neurosurgery Service Progress Note  Subjective: No acute events overnight, having some numbness and pain in the proximal left thigh that travels from the left iliac crest medially to the medial aspect of the thigh, crossing over midline at the proximal 1/3 of the thigh.  Objective: Vitals:   12/26/18 1727 12/26/18 1956 12/26/18 2330 12/27/18 0458  BP: 118/76 110/68 99/65 127/72  Pulse: 89 96 79 75  Resp: 16 18 18 18   Temp: 97.7 F (36.5 C) 98.2 F (36.8 C) 98.1 F (36.7 C) 97.9 F (36.6 C)  TempSrc: Oral Oral Oral Oral  SpO2: 98% 100% 99% 100%  Weight:      Height:       Temp (24hrs), Avg:98 F (36.7 C), Min:97.5 F (36.4 C), Max:98.9 F (37.2 C)  CBC Latest Ref Rng & Units 12/26/2018  WBC 4.0 - 10.5 K/uL 6.6  Hemoglobin 12.0 - 15.0 g/dL 16.112.1  Hematocrit 09.636.0 - 46.0 % 38.5  Platelets 150 - 400 K/uL 249   BMP Latest Ref Rng & Units 12/26/2018  Glucose 70 - 99 mg/dL 97  BUN 6 - 20 mg/dL 12  Creatinine 0.450.44 - 4.091.00 mg/dL 8.110.84  Sodium 914135 - 782145 mmol/L 137  Potassium 3.5 - 5.1 mmol/L 3.9  Chloride 98 - 111 mmol/L 103  CO2 22 - 32 mmol/L 24  Calcium 8.9 - 10.3 mg/dL 9.4    Intake/Output Summary (Last 24 hours) at 12/27/2018 0904 Last data filed at 12/27/2018 0300 Gross per 24 hour  Intake 1390 ml  Output 1460 ml  Net -70 ml    Current Facility-Administered Medications:  .  acetaminophen (TYLENOL) tablet 650 mg, 650 mg, Oral, Q4H PRN **OR** acetaminophen (TYLENOL) suppository 650 mg, 650 mg, Rectal, Q4H PRN, Maeola HarmanStern, Joseph, MD .  alum & mag hydroxide-simeth (MAALOX/MYLANTA) 200-200-20 MG/5ML suspension 30 mL, 30 mL, Oral, Q6H PRN, Maeola HarmanStern, Joseph, MD .  bisacodyl (DULCOLAX) suppository 10 mg, 10 mg, Rectal, Daily PRN, Maeola HarmanStern, Joseph, MD .  Melene Muller[START ON 12/29/2018] conjugated estrogens (PREMARIN) vaginal cream 0.5 Applicatorful, 0.5 Applicatorful, Vaginal, Once per day on Mon Thu, Stern, Jomarie LongsJoseph, MD .  dextrose 5 % and 0.45 % NaCl with KCl 20 mEq/L infusion, , Intravenous,  Continuous, Maeola HarmanStern, Joseph, MD, Stopped at 12/27/18 [phone] .  docusate sodium (COLACE) capsule 100 mg, 100 mg, Oral, BID, Maeola HarmanStern, Joseph, MD, 100 mg at 12/26/18 2115 .  HYDROcodone-acetaminophen (NORCO) 7.5-325 MG per tablet 1 tablet, 1 tablet, Oral, Q4H PRN, Maeola HarmanStern, Joseph, MD, 1 tablet at 12/27/18 0453 .  ketorolac (TORADOL) 30 MG/ML injection 30 mg, 30 mg, Intravenous, Q6H, Maeola HarmanStern, Joseph, MD, 30 mg at 12/27/18 0510 .  ketorolac (TORADOL) 30 MG/ML injection 30 mg, 30 mg, Intravenous, Q6H PRN, Maeola HarmanStern, Joseph, MD .  menthol-cetylpyridinium (CEPACOL) lozenge 3 mg, 1 lozenge, Oral, PRN **OR** phenol (CHLORASEPTIC) mouth spray 1 spray, 1 spray, Mouth/Throat, PRN, Maeola HarmanStern, Joseph, MD .  methocarbamol (ROBAXIN) tablet 500 mg, 500 mg, Oral, Q6H PRN, 500 mg at 12/27/18 0453 **OR** methocarbamol (ROBAXIN) 500 mg in dextrose 5 % 50 mL IVPB, 500 mg, Intravenous, Q6H PRN, Maeola HarmanStern, Joseph, MD .  morphine 2 MG/ML injection 2 mg, 2 mg, Intravenous, Q2H PRN, Maeola HarmanStern, Joseph, MD, 2 mg at 12/26/18 1504 .  ondansetron (ZOFRAN) tablet 4 mg, 4 mg, Oral, Q6H PRN **OR** ondansetron (ZOFRAN) injection 4 mg, 4 mg, Intravenous, Q6H PRN, Maeola HarmanStern, Joseph, MD .  oxyCODONE (Oxy IR/ROXICODONE) immediate release tablet 10 mg, 10 mg, Oral, Q3H PRN, Maeola HarmanStern, Joseph, MD, 10 mg at 12/26/18 2115 .  pantoprazole (PROTONIX) injection 40 mg, 40 mg, Intravenous, QHS, Maeola HarmanStern, Joseph, MD, 40 mg at 12/26/18 2116 .  polyethylene glycol (MIRALAX / GLYCOLAX) packet 17 g, 17 g, Oral, Daily PRN, Maeola HarmanStern, Joseph, MD .  sodium chloride flush (NS) 0.9 % injection 3 mL, 3 mL, Intravenous, Q12H, Maeola HarmanStern, Joseph, MD .  sodium chloride flush (NS) 0.9 % injection 3 mL, 3 mL, Intravenous, PRN, Maeola HarmanStern, Joseph, MD .  sodium phosphate (FLEET) 7-19 GM/118ML enema 1 enema, 1 enema, Rectal, Once PRN, Maeola HarmanStern, Joseph, MD .  zolpidem [facility](AMBIEN) tablet 5 mg, 5 mg, Oral, QHS PRN, Maeola HarmanStern, Joseph, MD  Facility-Administered Medications Ordered in Other Encounters:  .  ondansetron (ZOFRAN) 4 mg  in sodium chloride 0.9 % 50 mL IVPB, 4 mg, Intravenous, Q6H PRN, Coletta Memosabbell, Kyle, MD   Physical Exam: Strength 5/5 x4 except left hip flexor weakness 1/5, SILT except for left L2 distribution  Assessment & Plan: 53 y.o. woman s/p L4-5 ALIF, recovering well except for left L2 numbness and hip flexor weakness. -her numbness and HF weakness, if localized to the lumbar spine, would be L2, which is too far out of the operative field to be effected. Most likely explanation for HF weakness is from lumbar plexus retraction, but will get CT L-spine to confirm hardware intact and no changes along the L2 nerve root. If it looks okay, likely a retraction injury that should improve with time -if CT negative, pt feels comfortable with discharge, will have her follow up with Dr. Venetia MaxonStern at a short interval to make sure she is progressing well  Kendra Mann A   12/27/18 9:04 AM

## 2018-12-27 NOTE — Progress Notes (Signed)
Occupational Therapy Treatment Patient Details Name: Kendra Mann MRN: [id number] DOB: 16-Aug-1966 Today's Date: 12/27/2018    History of present illness Patient is a 53 y/o female s/p Lumbar 4-5 Anterior lumbar interbody fusion.   OT comments  Pt progressing towards established OT goals. Pt continues to present with decreased strength and ROM at LLE impacting her functional performance and balance. Providing pt with education on AE for LB ADLs and tub transfer. Pt donning pants and socks with AE and supervision; requiring Min A for donning shoes. Pt requiring Min A for brace management and positioning. Pt performing simulated tub transfer with Min Guard A, BSC, and RW. Continue to recommend dc home with HHOT and will continue to follow acutely as admitted.    Follow Up Recommendations  Home health OT;Supervision/Assistance - 24 hour    Equipment Recommendations  3 in 1 bedside commode    Recommendations for Other Services      Precautions / Restrictions Precautions Precautions: Fall;Back Precaution Booklet Issued: Yes (comment) Precaution Comments: Pt able to state 3/3 back precautions Required Braces or Orthoses: Spinal Brace Spinal Brace: Lumbar corset;Applied in sitting position Restrictions Weight Bearing Restrictions: No       Mobility Bed Mobility               General bed mobility comments: OOB in chair  Transfers Overall transfer level: Needs assistance Equipment used: Rolling walker (2 wheeled) Transfers: Sit to/from Stand Sit to Stand: Supervision         General transfer comment: Supervision for safety    Balance Overall balance assessment: Needs assistance Sitting-balance support: Feet supported Sitting balance-Leahy Scale: Good Sitting balance - Comments: Requires BUe support sitting due to increased pain; assist to donn LSO   Standing balance support: Single extremity supported;During functional activity Standing balance-Leahy Scale: Fair                              ADL either performed or assessed with clinical judgement   ADL Overall ADL's : Needs assistance/impaired               Lower Body Bathing Details (indicate cue type and reason): Educated pt on AE for LB bathing.  Upper Body Dressing : Supervision/safety;Sitting;Minimal assistance Upper Body Dressing Details (indicate cue type and reason): Supervision for donning her shirt. Requiring Min A for managing and positioning brace. Reviewed brace management Lower Body Dressing: Sit to/from stand;With adaptive equipment;Supervision/safety;Minimal assistance Lower Body Dressing Details (indicate cue type and reason): Educated pt on AE for LB dressing. Pt donning underwear, pants, and socks with supervision. Pt requiring Min A for donning shoes due to limitations at LLE.       Toileting - Clothing Manipulation Details (indicate cue type and reason): Educating pt on compensatory techniques for toilet hygiene as well as AE to optmize independence Tub/ Shower Transfer: Tub transfer;Min guard;Ambulation;Rolling walker;3 in 1 Tub/Shower Transfer Details (indicate cue type and reason): Educating pt on safe tub transfer. Pt demonstrating understanding with Min Guard A, BSC, and RW. Main limitation due to decreased hip flexion and strength at LLE.  Functional mobility during ADLs: Rolling walker;Supervision/safety;Min guard General ADL Comments: Providing education on AE for LB ADLs and safe tub transfer.      Vision       Perception     Praxis      Cognition Arousal/Alertness: Awake/alert Behavior During Therapy: WFL for tasks assessed/performed Overall Cognitive Status: Within Functional  Limits for tasks assessed                                          Exercises     Shoulder Instructions       General Comments      Pertinent Vitals/ Pain       Pain Assessment: Faces Faces Pain Scale: Hurts even more Pain Location: LLE Pain  Descriptors / Indicators: Other (Comment)("pulling") Pain Intervention(s): Monitored during session;Limited activity within patient's tolerance;Repositioned  Home Living                                          Prior Functioning/Environment              Frequency  Min 2X/week        Progress Toward Goals  OT Goals(current goals can now be found in the care plan section)  Progress towards OT goals: Progressing toward goals  Acute Rehab OT Goals Patient Stated Goal: to make this left leg pain better and be able to move my leg normally again OT Goal Formulation: With patient Time For Goal Achievement: 01/09/19 Potential to Achieve Goals: Good ADL Goals Pt Will Perform Grooming: with modified independence;standing Pt Will Perform Lower Body Dressing: with modified independence;sit to/from stand;with adaptive equipment Pt Will Transfer to Toilet: with modified independence;ambulating;regular height toilet;bedside commode(over toilet) Pt Will Perform Toileting - Clothing Manipulation and hygiene: with modified independence;sit to/from stand Pt Will Perform Tub/Shower Transfer: Tub transfer;with modified independence;ambulating;rolling walker;shower seat Additional ADL Goal #1: Pt will Mod I in and OOB for basic ADLs Additional ADL Goal #2: Pt will be able to state 3/3 back precautions  Plan Equipment recommendations need to be updated;All goals met and education completed, patient discharged from OT services    Co-evaluation                 AM-PAC OT "6 Clicks" Daily Activity     Outcome Measure   Help from another person eating meals?: None Help from another person taking care of personal grooming?: A Little Help from another person toileting, which includes using toliet, bedpan, or urinal?: A Little Help from another person bathing (including washing, rinsing, drying)?: A Little Help from another person to put on and taking off regular upper body  clothing?: A Little Help from another person to put on and taking off regular lower body clothing?: A Little 6 Click Score: 19    End of Session Equipment Utilized During Treatment: Gait belt;Back brace  OT Visit Diagnosis: Unsteadiness on feet (R26.81);Other abnormalities of gait and mobility (R26.89);Pain Pain - part of body: (abdomen, back, LLE)   Activity Tolerance Patient tolerated treatment well   Patient Left in chair;with call bell/phone within reach   Nurse Communication Mobility status        Time: [id number] OT Time Calculation (min): 29 min  Charges: OT General Charges $OT Visit: 1 Visit OT Treatments $Self Care/Home Management : 23-37 mins  Kendra Mann MSOT, OTR/L Acute Rehab Pager: [phone] Office: [phone]   Kendra Mann 12/27/2018, 10:58 AM

## 2018-12-27 NOTE — Progress Notes (Signed)
Patient alert and oriented, mae's well, voiding adequate amount of urine, swallowing without difficulty, c/o pain and numbness around hip area. MD aware and will continue to follow up. Patient discharged home with family. Script and discharged instructions given to patient. Patient and family stated understanding of instructions given and to call Dr. Venetia Maxon if any problem occurs. Patient has an appointment with Dr. Venetia Maxon

## 2018-12-27 NOTE — Discharge Instructions (Addendum)
Discharge Instructions  No restriction in activities, slowly increase your activity back to normal. No heavy lifting for 2 weeks until you see Dr. Venetia MaxonStern in clinic. Walking is the best activity to help your spine with healing as well as keep active.  Okay to shower on the day of discharge. Use regular soap and water and try to be gentle when cleaning your incision.   Follow up with Dr. Venetia MaxonStern in 2 weeks after discharge. If you do not already have a discharge appointment, please call his office at [phone] to schedule a follow up appointment. If you have any concerns or questions, please call the office and let us know.  Wound Care Leave incision open to air. You may shower. Do not scrub directly on incision.  Do not put any creams, lotions, or ointments on incision. Activity Walk each and every day, increasing distance each day. No lifting greater than 5 lbs.  Avoid bending, arching, and twisting. No driving for 2 weeks; may ride as a passenger locally. If provided with back brace, wear when out of bed.  It is not necessary to wear in bed. Diet Resume your normal diet.  Return to Work Will be discussed at you follow up appointment. Call Your Doctor If Any of These Occur Redness, drainage, or swelling at the wound.  Temperature greater than 101 degrees. Severe pain not relieved by pain medication. Incision starts to come apart. Follow Up Appt Call today for appointment in 3-4 weeks ([phone]) or for problems.  If you have any hardware placed in your spine, you will need an x-ray before your appointment.

## 2018-12-27 NOTE — Progress Notes (Signed)
Physical Therapy Treatment Patient Details Name: Kendra CaperMelissa L Sesma MRN: [id number] DOB: 1966/02/10 Today's Date: 12/27/2018    History of Present Illness Patient is a 53 y/o female s/p Lumbar 4-5 Anterior lumbar interbody fusion.    PT Comments    Pt continues with numbness and pain in proximal left thigh and no discernable hip flexor strength. Session focused on continued progression of activity tolerance and gait training. Pt ambulating 200 feet with walker and noted gait abnormalities due to deficits listed above. Cues for walker management and positioning. D/c plan remains appropriate.     Follow Up Recommendations  Home health PT;Supervision for mobility/OOB     Equipment Recommendations  Rolling walker with 5" wheels;3in1 (PT)    Recommendations for Other Services       Precautions / Restrictions Precautions Precautions: Fall;Back Precaution Booklet Issued: Yes (comment) Precaution Comments: reviewed back precautions Required Braces or Orthoses: Spinal Brace Spinal Brace: Lumbar corset;Applied in sitting position Restrictions Weight Bearing Restrictions: No    Mobility  Bed Mobility               General bed mobility comments: OOB in chair  Transfers Overall transfer level: Modified independent Equipment used: Rolling walker (2 wheeled)                Ambulation/Gait Ambulation/Gait assistance: Supervision Gait Distance (Feet): 200 Feet Assistive device: Rolling walker (2 wheeled) Gait Pattern/deviations: Step-through pattern;Decreased stance time - left;Decreased step length - right Gait velocity: decreased   General Gait Details: Pt with heavy reliance on walker through BUE's, and decreased left foot clearance due to hip flexor weakness. cues for walker positioning and negotiation   Stairs             Wheelchair Mobility    Modified Rankin (Stroke Patients Only)       Balance Overall balance assessment: Needs  assistance Sitting-balance support: Feet supported Sitting balance-Leahy Scale: Good     Standing balance support: Single extremity supported;During functional activity Standing balance-Leahy Scale: Fair                              Cognition Arousal/Alertness: Awake/alert Behavior During Therapy: WFL for tasks assessed/performed Overall Cognitive Status: Within Functional Limits for tasks assessed                                        Exercises      General Comments        Pertinent Vitals/Pain Pain Assessment: Faces Faces Pain Scale: Hurts even more Pain Location: LLE Pain Descriptors / Indicators: Other (Comment)("pulling") Pain Intervention(s): Monitored during session    Home Living                      Prior Function            PT Goals (current goals can now be found in the care plan section) Acute Rehab PT Goals Patient Stated Goal: to make this left leg pain better and be able to move my leg normally again Potential to Achieve Goals: Good Progress towards PT goals: Progressing toward goals    Frequency    Min 5X/week      PT Plan Current plan remains appropriate    Co-evaluation              AM-PAC PT "6  Clicks" Mobility   Outcome Measure  Help needed turning from your back to your side while in a flat bed without using bedrails?: None Help needed moving from lying on your back to sitting on the side of a flat bed without using bedrails?: A Little Help needed moving to and from a bed to a chair (including a wheelchair)?: None Help needed standing up from a chair using your arms (e.g., wheelchair or bedside chair)?: A Little Help needed to walk in hospital room?: A Little Help needed climbing 3-5 steps with a railing? : A Lot 6 Click Score: 19    End of Session Equipment Utilized During Treatment: Back brace Activity Tolerance: Patient tolerated treatment well Patient left: in chair;with call  bell/phone within reach Nurse Communication: Mobility status PT Visit Diagnosis: Pain;Difficulty in walking, not elsewhere classified (R26.2);Muscle weakness (generalized) (M62.81) Pain - Right/Left: Left Pain - part of body: Hip;Leg     Time: 0850-0900 PT Time Calculation (min) (ACUTE ONLY): 10 min  Charges:  $Gait Training: 8-22 mins                     Laurina Bustlearoline Kalah Pflum, PT, DPT Acute Rehabilitation Services Pager [phone] Office [phone]    Vanetta MuldersCarloine H Leitha Hyppolite 12/27/2018, 9:41 AM

## 2018-12-30 ENCOUNTER — Encounter (HOSPITAL_COMMUNITY): Payer: Self-pay | Admitting: Neurosurgery

## 2018-12-30 MED FILL — Heparin Sodium (Porcine) Inj 1000 Unit/ML: INTRAMUSCULAR | Qty: 10 | Status: AC

## 2018-12-30 MED FILL — Sodium Chloride IV Soln 0.9%: INTRAVENOUS | Qty: 1000 | Status: AC

## 2019-08-08 IMAGING — CT CT L SPINE W/ CM
3 series · 12 of 33 positions shown, 14 images · non-contrast
Comparison: None

CLINICAL DATA: Low back pain with right sciatica

EXAM:
LUMBAR MYELOGRAM
FLUOROSCOPY TIME:  0.9 minutes
PROCEDURE:
Lumbar puncture and intrathecal contrast administration were
performed by Dr Major who will separately report for the portion of
the procedure. I personally supervised acquisition of the myelogram
images.
TECHNIQUE: Contiguous axial images were obtained through the Lumbar spine after
the intrathecal infusion of infusion. Coronal and sagittal
reconstructions were obtained of the axial image sets.

[Series 4: l-spine 2.0 st · axial · 0.34mm/px · z∈[+980,+1146]mm · 4 of 121 slices shown, 5 images]
[im 19/121  soft-tissue]
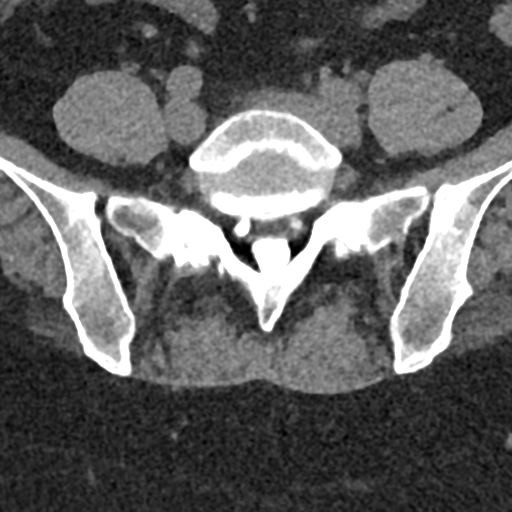
[im 19/121  bone]
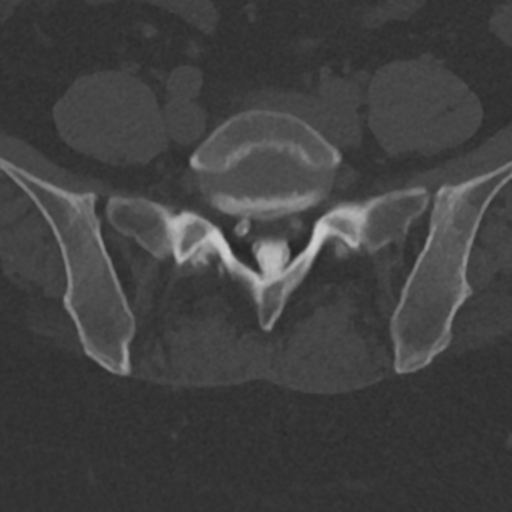
[im 47/121  bone]
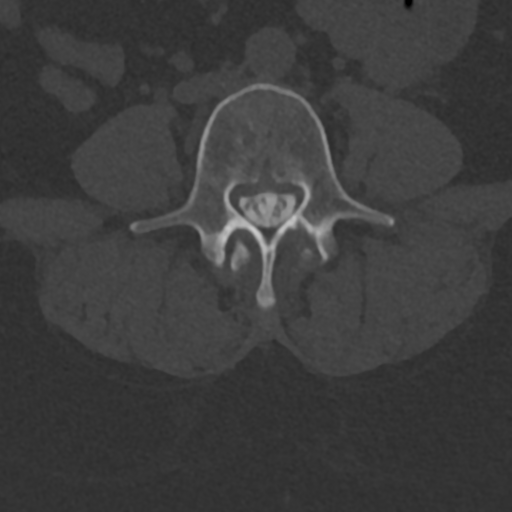
[im 74/121  bone]
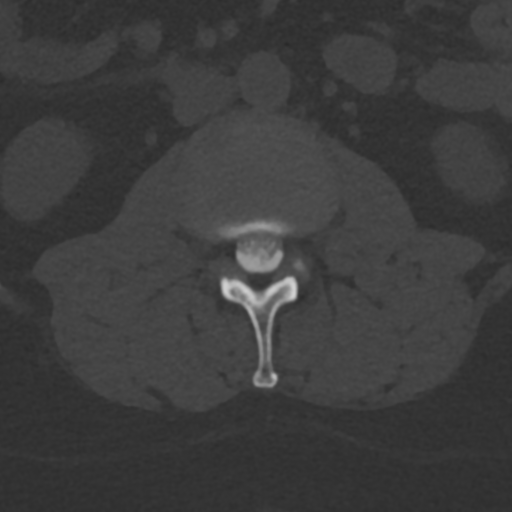
[im 102/121  bone]
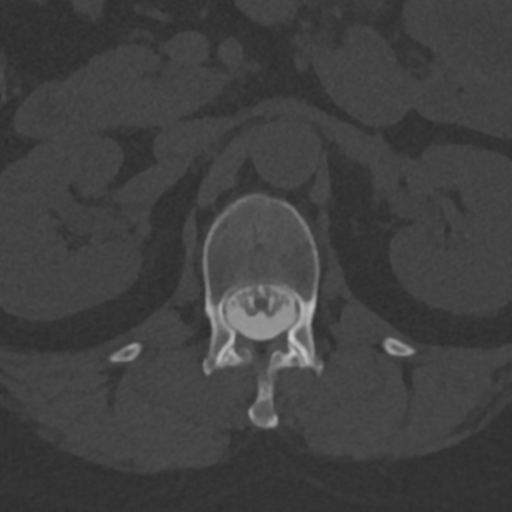

[Series 8: l-spine 2.0 cor bone · coronal · 0.35mm/px · 3 of 65 slices shown]
[im 13/65  bone]
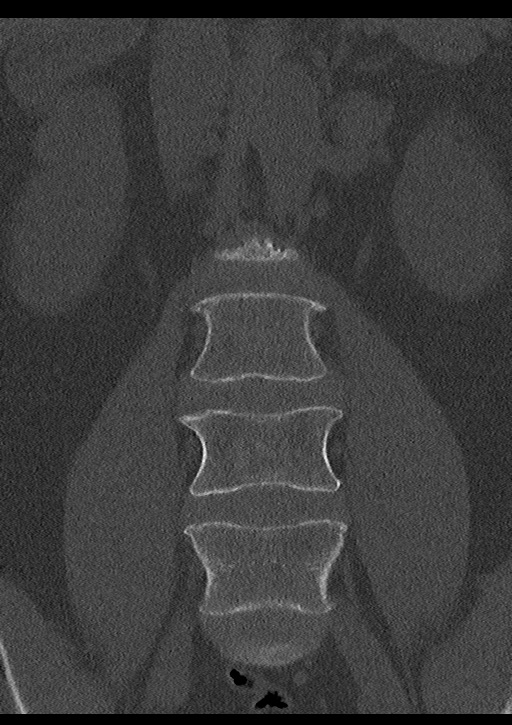
[im 26/65  bone]
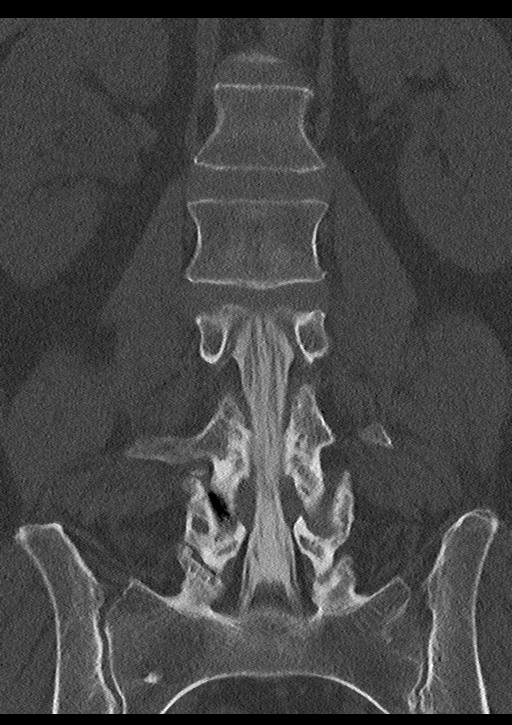
[im 39/65  bone]
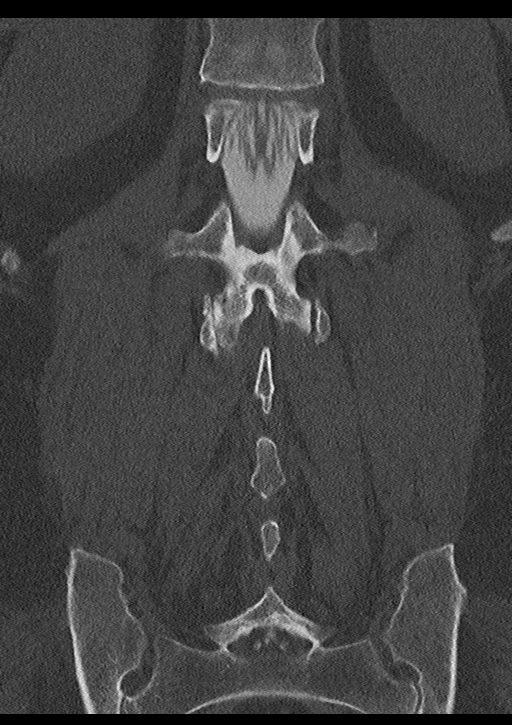

[Series 9: l-spine 2.0 sag bone · sagittal · 0.35mm/px · 5 of 61 slices shown, 6 images]
[im 21/61  bone]
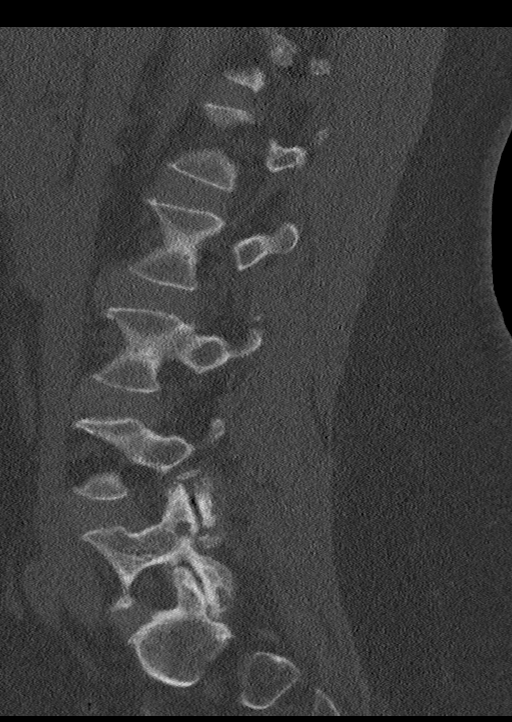
[im 26/61  bone]
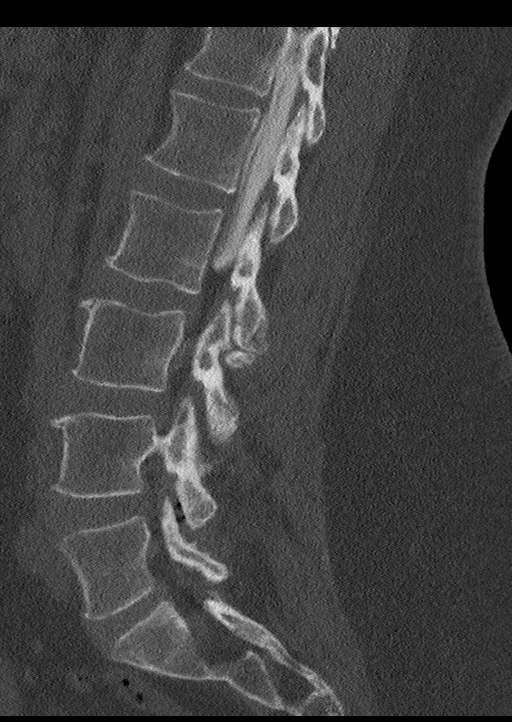
[im 31/61  soft-tissue]
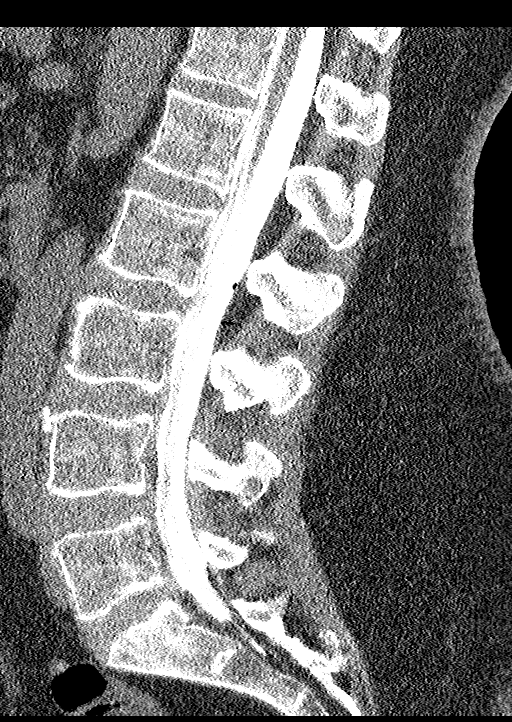
[im 31/61  bone]
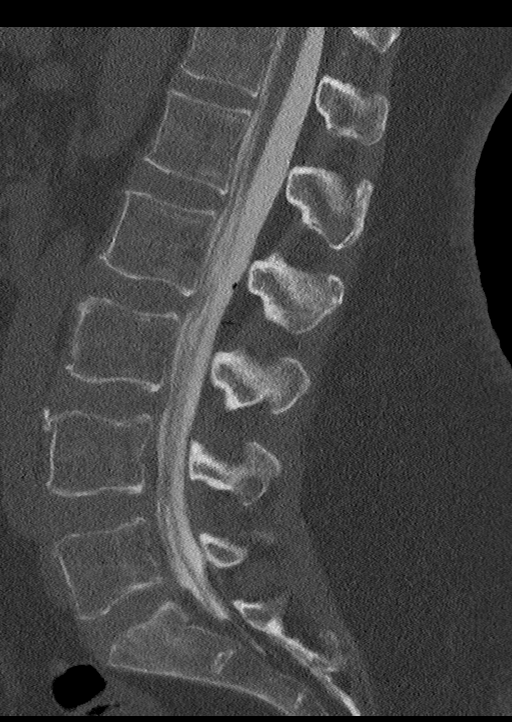
[im 36/61  bone]
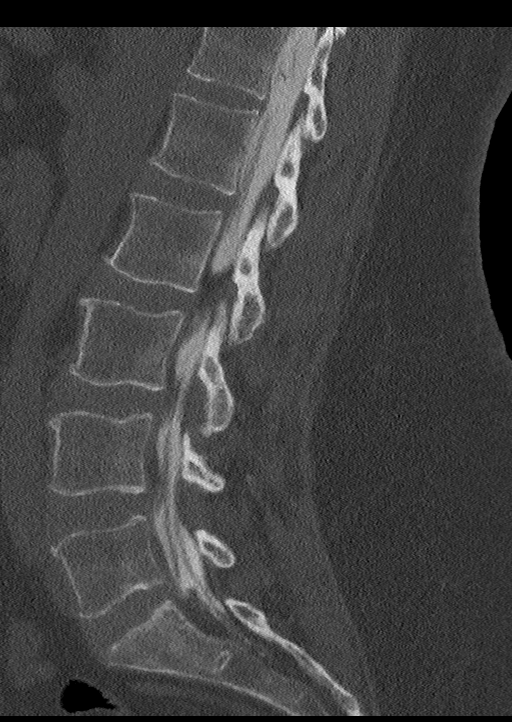
[im 41/61  bone]
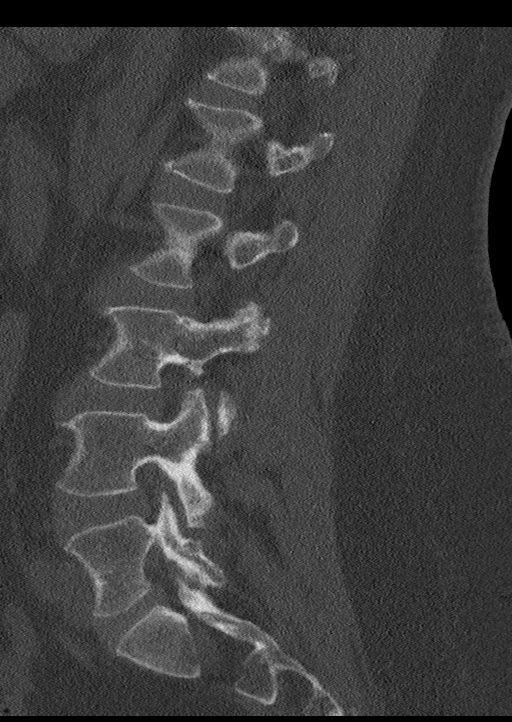

[12 of 33 positions shown; findings below may reference images not displayed]

FINDINGS: LUMBAR MYELOGRAM FINDINGS:

Five lumbar type vertebral bodies. Slight lumbar curvature on the
supine image which is considered positional based on the CT in the
same position. No listhesis.

There are ventral extradural defects throughout the lumbar spine
except at L5-S1, most notable at L3-4 where there is disc bulge that
worsens with extension and improves with flexion and causes up to
moderate stenosis.

CT LUMBAR MYELOGRAM FINDINGS:

Segmentation: 5 lumbar type vertebral bodies based on the lowest
ribs.

Alignment: Physiologic.

Vertebrae: No fracture, discitis, or aggressive bone lesion.

Canal: Normal conus morphology, tip terminating at L1-2. No nerve
root distortion or nodularity. Trace gas from injection.

Extra-spinal: Negative.

Disc levels:

T12- L1: Unremarkable.

L1-L2: Minor spondylosis.  No impingement

L2-L3: Mild spondylosis and right-sided facet spurring. No
impingement

L3-L4: Spondylosis and mild disc bulging. Degenerative posterior
element hypertrophy. No impingement

L4-L5: Mild disc bulging. Prominent facet degeneration with
bilateral spurring of right-sided vacuum joint. Noncompressive right
more than left foraminal narrowing. Patent spinal canal.

L5-S1:Degenerative facet spurring greater on the right.
IMPRESSION: 1. Degenerative facet spurring at L2-3 and below, most prominent at
L4-5. No listhesis.
2. At CT myelogram of canal is diffusely patent. When standing and
with extension bulge and ligamentous buckling causes moderate canal
narrowing at L3-4.
3. No compressive foraminal stenosis.

## 2019-11-28 IMAGING — CR DG OR LOCAL ABDOMEN
1 series · 1 of 1 positions shown · non-contrast
Comparison: Portable exam 1918 hours without priors for comparison

CLINICAL DATA: OR localization image for instrument and sponge
counts

EXAM:
OR LOCAL ABDOMEN

[AP]
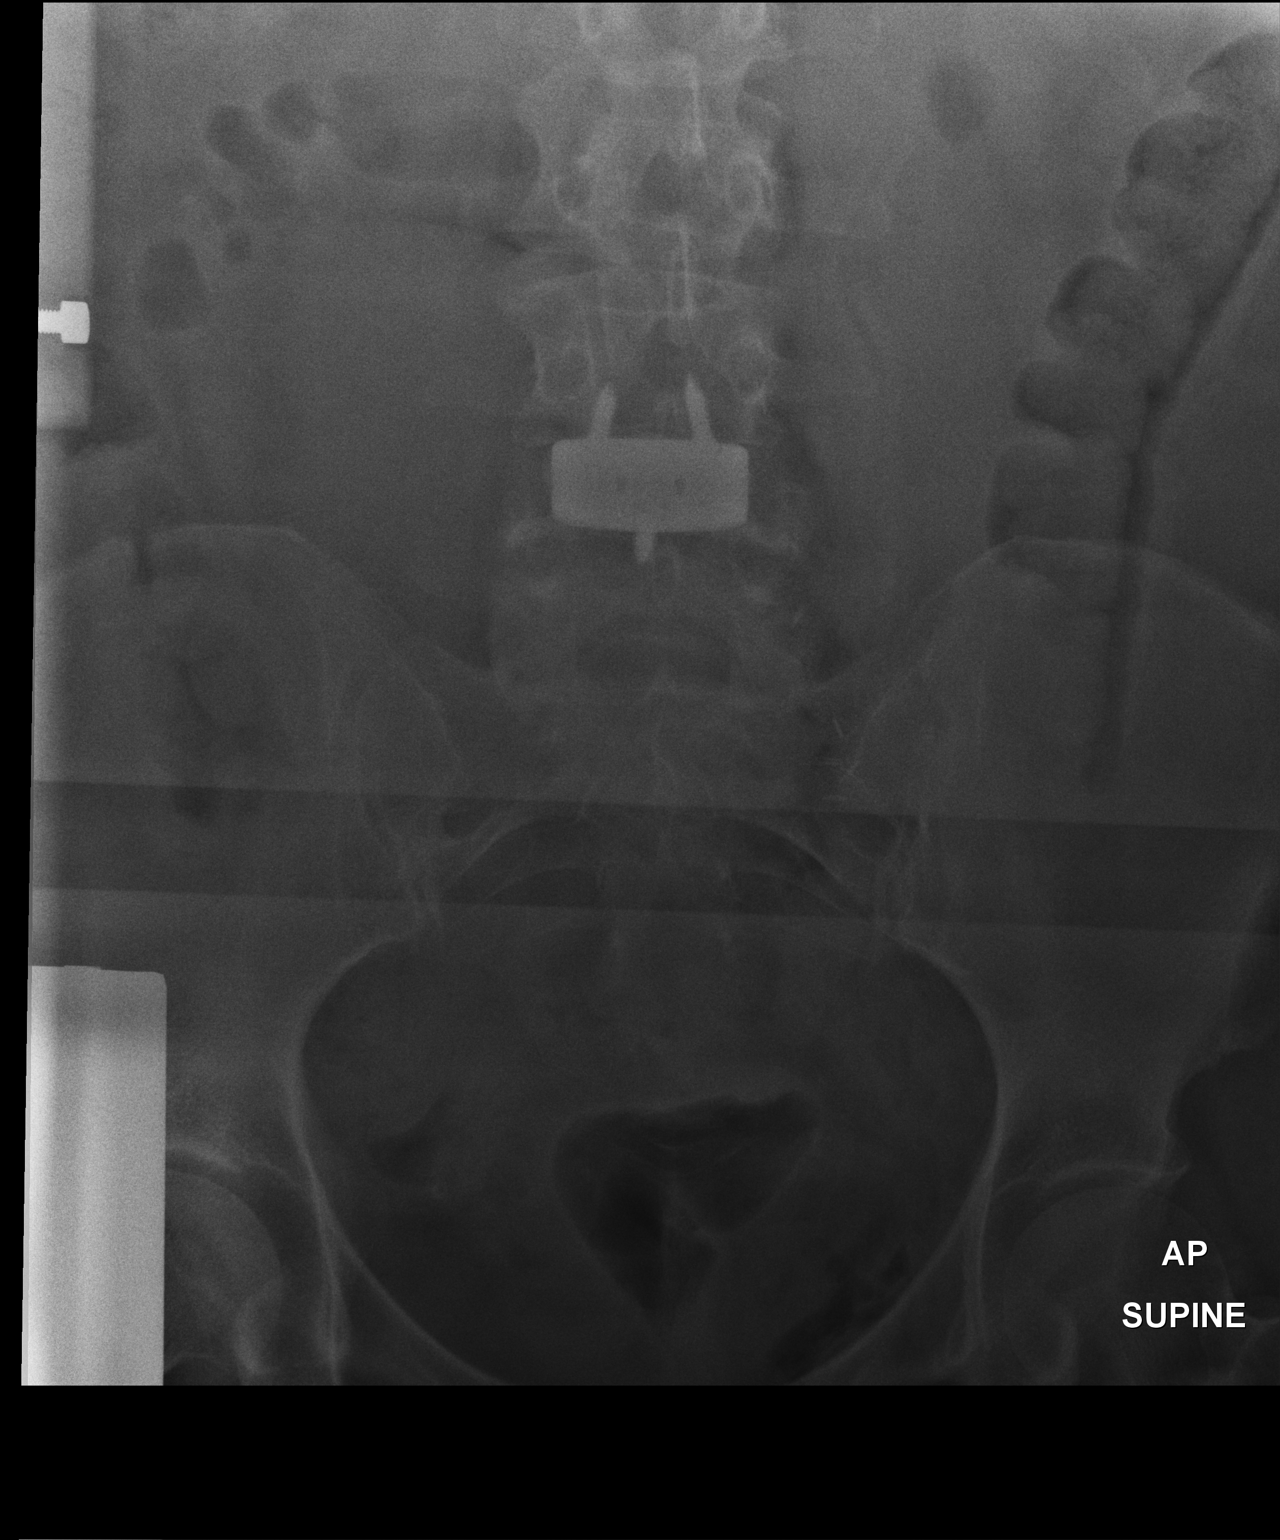

[1 of 1 positions shown; findings below may reference images not displayed]

FINDINGS: Spinal hardware at L4-L5.

Question surgical clips projecting over upper LEFT sacrum.

No surgical instruments identified.

No radiopaque curvilinear sponge identified.
IMPRESSION: No surgical instruments or sponges seen.

Findings called to the OR on 12/26/2018 at 7777 hours.

## 2019-12-16 ENCOUNTER — Other Ambulatory Visit: Payer: Self-pay | Admitting: Neurosurgery

## 2019-12-25 ENCOUNTER — Other Ambulatory Visit: Payer: Self-pay

## 2019-12-25 ENCOUNTER — Encounter (HOSPITAL_COMMUNITY)
Admission: RE | Admit: 2019-12-25 | Discharge: 2019-12-25 | Disposition: A | Discharge status: Home / Self Care | Payer: BC Managed Care – PPO | Source: Ambulatory Visit | Attending: Neurosurgery | Admitting: Neurosurgery

## 2019-12-25 ENCOUNTER — Encounter (HOSPITAL_COMMUNITY): Payer: Self-pay

## 2019-12-25 ENCOUNTER — Other Ambulatory Visit (HOSPITAL_COMMUNITY)
Admission: RE | Admit: 2019-12-25 | Discharge: 2019-12-25 | Disposition: A | Discharge status: Home / Self Care | Payer: BC Managed Care – PPO | Source: Ambulatory Visit | Attending: Neurosurgery | Admitting: Neurosurgery

## 2019-12-25 DIAGNOSIS — Z01812 Encounter for preprocedural laboratory examination: Secondary | ICD-10-CM | POA: Insufficient documentation

## 2019-12-25 LAB — CBC
HCT: 39.9 % (ref 36.0–46.0)
Hemoglobin: 13.1 g/dL (ref 12.0–15.0)
MCH: 32.9 pg (ref 26.0–34.0)
MCHC: 32.8 g/dL (ref 30.0–36.0)
MCV: 100.3 fL — ABNORMAL HIGH (ref 80.0–100.0)
Platelets: 251 10*3/uL (ref 150–400)
RBC: 3.98 MIL/uL (ref 3.87–5.11)
RDW: 13.2 % (ref 11.5–15.5)
WBC: 9 10*3/uL (ref 4.0–10.5)
nRBC: 0 % (ref 0.0–0.2)

## 2019-12-25 LAB — SURGICAL PCR SCREEN
MRSA, PCR: NEGATIVE
Staphylococcus aureus: NEGATIVE

## 2019-12-25 LAB — TYPE AND SCREEN
ABO/RH(D): O POS
Antibody Screen: NEGATIVE

## 2019-12-25 NOTE — Progress Notes (Signed)
PCP - Darryl Lent, PA-C Cardiologist - Denies  PPM/ICD - Denies  Chest x-ray - N/A EKG - N/A Stress Test - Denies ECHO - 2001, per patient, results were normal, with no follow up needed. Cardiac Cath - Denies  Sleep Study - Denies  Patient denies being a diabetic.  Blood Thinner Instructions: N/A Aspirin Instructions: N/A  ERAS Protcol - None PRE-SURGERY Ensure or G2- None  COVID TEST- 12/25/19   Anesthesia review: No  Patient denies shortness of breath, fever, cough and chest pain at PAT appointment  Coronavirus Screening  Have you experienced the following symptoms:  Cough yes/no: No Fever (>100.7F)  yes/no: No Runny nose yes/no: No Sore throat yes/no: No Difficulty breathing/shortness of breath  yes/no: No  Have you or a family member traveled in the last 14 days and where? yes/no: No   If the patient indicates "YES" to the above questions, their PAT will be rescheduled to limit the exposure to others and, the surgeon will be notified. THE PATIENT WILL NEED TO BE ASYMPTOMATIC FOR 14 DAYS.   If the patient is not experiencing any of these symptoms, the PAT nurse will instruct them to NOT bring anyone with them to their appointment since they may have these symptoms or traveled as well.   Please remind your patients and families that hospital visitation restrictions are in effect and the importance of the restrictions.    All instructions explained to the patient, with a verbal understanding of the material. Patient agrees to go over the instructions while at home for a better understanding. Patient also instructed to self quarantine after being tested for COVID-19. The opportunity to ask questions was provided.

## 2019-12-25 NOTE — Pre-Procedure Instructions (Signed)
Your procedure is scheduled on Tuesday, January 19th, from 07:30 AM to 11:14 AM.  Report to [facility] Main Entrance "A" at 05:30 A.M., and check in at the Admitting office.  Call this number if you have problems the morning of surgery:  [phone]  Call [phone] if you have any questions prior to your surgery date Monday-Friday 8am-4pm    Remember:  Do not eat or drink after midnight the night before your surgery.     Take these medicines the morning of surgery with A SIP OF WATER : methocarbamol (ROBAXIN)- if needed Oxycodone HCl- if needed   As of today, STOP taking any Aspirin (unless otherwise instructed by your surgeon), Aleve, Naproxen, Ibuprofen, Motrin, Advil, Goody's, BC's, all herbal medications, fish oil, and all vitamins.    The Morning of Surgery  Do not wear jewelry, make-up or nail polish.  Do not wear lotions, powders, perfumes, or deodorant  Do not shave 48 hours prior to surgery.    Do not bring valuables to the hospital.  [facility] is not responsible for any belongings or valuables.  If you are a smoker, DO NOT Smoke 24 hours prior to surgery  If you wear a CPAP at night please bring your mask, tubing, and machine the morning of surgery   Remember that you must have someone to transport you home after your surgery, and remain with you for 24 hours if you are discharged the same day.   Please bring cases for contacts, glasses, hearing aids, dentures or bridgework because it cannot be worn into surgery.    Leave your suitcase in the car.  After surgery it may be brought to your room.  For patients admitted to the hospital, discharge time will be determined by your treatment team.  Patients discharged the day of surgery will not be allowed to drive home.    Special instructions:   [facility]- Preparing For Surgery  Before surgery, you can play an important role. Because skin is not sterile, your skin needs to be as free of germs as possible.  You can reduce the number of germs on your skin by washing with CHG (chlorahexidine gluconate) Soap before surgery.  CHG is an antiseptic cleaner which kills germs and bonds with the skin to continue killing germs even after washing.    Oral Hygiene is also important to reduce your risk of infection.  Remember - BRUSH YOUR TEETH THE MORNING OF SURGERY WITH YOUR REGULAR TOOTHPASTE  Please do not use if you have an allergy to CHG or antibacterial soaps. If your skin becomes reddened/irritated stop using the CHG.  Do not shave (including legs and underarms) for at least 48 hours prior to first CHG shower. It is OK to shave your face.  Please follow these instructions carefully.   1. Shower the NIGHT BEFORE SURGERY and the MORNING OF SURGERY with CHG Soap.   2. If you chose to wash your hair, wash your hair first as usual with your normal shampoo.  3. After you shampoo, rinse your hair and body thoroughly to remove the shampoo.  4. Use CHG as you would any other liquid soap. You can apply CHG directly to the skin and wash gently with a scrungie or a clean washcloth.   5. Apply the CHG Soap to your body ONLY FROM THE NECK DOWN.  Do not use on open wounds or open sores. Avoid contact with your eyes, ears, mouth and genitals (private parts). Wash Face and  genitals (private parts)  with your normal soap.   6. Wash thoroughly, paying special attention to the area where your surgery will be performed.  7. Thoroughly rinse your body with warm water from the neck down.  8. DO NOT shower/wash with your normal soap after using and rinsing off the CHG Soap.  9. Pat yourself dry with a CLEAN TOWEL.  10. Wear CLEAN PAJAMAS to bed the night before surgery, wear comfortable clothes the morning of surgery  11. Place CLEAN SHEETS on your bed the night of your first shower and DO NOT SLEEP WITH PETS.    Day of Surgery:  Remember to brush your teeth WITH YOUR REGULAR TOOTHPASTE. Please shower the  morning of surgery with the CHG soap Do not apply any deodorants/lotions. Please wear clean clothes to the hospital/surgery center.     Please read over the following fact sheets that you were given.

## 2019-12-26 LAB — NOVEL CORONAVIRUS, NAA (HOSP ORDER, SEND-OUT TO REF LAB; TAT 18-24 HRS): SARS-CoV-2, NAA: NOT DETECTED

## 2019-12-29 ENCOUNTER — Inpatient Hospital Stay (HOSPITAL_COMMUNITY): Payer: BC Managed Care – PPO | Admitting: Anesthesiology

## 2019-12-29 ENCOUNTER — Inpatient Hospital Stay (HOSPITAL_COMMUNITY): Payer: BC Managed Care – PPO

## 2019-12-29 ENCOUNTER — Other Ambulatory Visit: Payer: Self-pay

## 2019-12-29 ENCOUNTER — Encounter (HOSPITAL_COMMUNITY): Payer: Self-pay | Admitting: Neurosurgery

## 2019-12-29 ENCOUNTER — Encounter (HOSPITAL_COMMUNITY)
Admission: RE | Disposition: A | Discharge status: Home / Self Care | Payer: Self-pay | Source: Home / Self Care | Attending: Neurosurgery

## 2019-12-29 ENCOUNTER — Inpatient Hospital Stay (HOSPITAL_COMMUNITY)
Admission: RE | Admit: 2019-12-29 | Discharge: 2019-12-30 | DRG: 455 | Disposition: A | Discharge status: Home / Self Care | Payer: BC Managed Care – PPO | Attending: Neurosurgery | Admitting: Neurosurgery

## 2019-12-29 DIAGNOSIS — M48061 Spinal stenosis, lumbar region without neurogenic claudication: Secondary | ICD-10-CM | POA: Diagnosis present

## 2019-12-29 DIAGNOSIS — Z419 Encounter for procedure for purposes other than remedying health state, unspecified: Secondary | ICD-10-CM

## 2019-12-29 DIAGNOSIS — M532X6 Spinal instabilities, lumbar region: Secondary | ICD-10-CM | POA: Diagnosis present

## 2019-12-29 DIAGNOSIS — M4807 Spinal stenosis, lumbosacral region: Secondary | ICD-10-CM | POA: Diagnosis present

## 2019-12-29 DIAGNOSIS — M4726 Other spondylosis with radiculopathy, lumbar region: Secondary | ICD-10-CM | POA: Diagnosis present

## 2019-12-29 DIAGNOSIS — M549 Dorsalgia, unspecified: Secondary | ICD-10-CM | POA: Diagnosis present

## 2019-12-29 DIAGNOSIS — M5416 Radiculopathy, lumbar region: Secondary | ICD-10-CM | POA: Diagnosis present

## 2019-12-29 SURGERY — POSTERIOR LUMBAR FUSION 2 LEVEL
Anesthesia: General | Site: Spine Lumbar

## 2019-12-29 MED ORDER — BISACODYL 10 MG RE SUPP: 10.0000 mg | Freq: Every day | RECTAL | Status: DC | PRN

## 2019-12-29 MED ORDER — FENTANYL CITRATE (PF) 100 MCG/2ML IJ SOLN
INTRAMUSCULAR | Status: AC
  Administered 2019-12-29: 50 ug via INTRAVENOUS
  Filled 2019-12-29: qty 2

## 2019-12-29 MED ORDER — OXYCODONE HCL 5 MG/5ML PO SOLN: 5.0000 mg | Freq: Once | ORAL | Status: DC | PRN

## 2019-12-29 MED ORDER — FENTANYL CITRATE (PF) 250 MCG/5ML IJ SOLN
INTRAMUSCULAR | Status: AC
  Filled 2019-12-29: qty 5

## 2019-12-29 MED ORDER — ACETAMINOPHEN 325 MG PO TABS
650.0000 mg | ORAL_TABLET | ORAL | Status: DC | PRN
  Administered 2019-12-29 – 2019-12-30 (×2): 650 mg via ORAL
  Filled 2019-12-29 (×2): qty 2

## 2019-12-29 MED ORDER — LIDOCAINE 2% (20 MG/ML) 5 ML SYRINGE
INTRAMUSCULAR | Status: DC | PRN
  Administered 2019-12-29: 50 mg via INTRAVENOUS

## 2019-12-29 MED ORDER — PHENYLEPHRINE HCL (PRESSORS) 10 MG/ML IV SOLN
INTRAVENOUS | Status: DC | PRN
  Administered 2019-12-29: 80 ug via INTRAVENOUS

## 2019-12-29 MED ORDER — METHOCARBAMOL 500 MG PO TABS: 500.0000 mg | ORAL_TABLET | Freq: Four times a day (QID) | ORAL | Status: DC | PRN

## 2019-12-29 MED ORDER — KCL IN DEXTROSE-NACL 20-5-0.45 MEQ/L-%-% IV SOLN
INTRAVENOUS | Status: DC
  Filled 2019-12-29: qty 1000

## 2019-12-29 MED ORDER — ONDANSETRON HCL 4 MG/2ML IJ SOLN
INTRAMUSCULAR | Status: DC | PRN
  Administered 2019-12-29: 4 mg via INTRAVENOUS

## 2019-12-29 MED ORDER — PHENOL 1.4 % MT LIQD: 1.0000 | OROMUCOSAL | Status: DC | PRN

## 2019-12-29 MED ORDER — ONDANSETRON HCL 4 MG/2ML IJ SOLN
INTRAMUSCULAR | Status: AC
  Filled 2019-12-29: qty 2

## 2019-12-29 MED ORDER — OXYCODONE HCL 5 MG PO TABS: 5.0000 mg | ORAL_TABLET | ORAL | Status: DC | PRN

## 2019-12-29 MED ORDER — 0.9 % SODIUM CHLORIDE (POUR BTL) OPTIME
TOPICAL | Status: DC | PRN
  Administered 2019-12-29: 1000 mL

## 2019-12-29 MED ORDER — FENTANYL CITRATE (PF) 100 MCG/2ML IJ SOLN
25.0000 ug | INTRAMUSCULAR | Status: DC | PRN
  Administered 2019-12-29: 50 ug via INTRAVENOUS

## 2019-12-29 MED ORDER — ONDANSETRON HCL 4 MG PO TABS: 4.0000 mg | ORAL_TABLET | Freq: Four times a day (QID) | ORAL | Status: DC | PRN

## 2019-12-29 MED ORDER — FENTANYL CITRATE (PF) 100 MCG/2ML IJ SOLN
INTRAMUSCULAR | Status: DC | PRN
  Administered 2019-12-29 (×8): 50 ug via INTRAVENOUS
  Administered 2019-12-29: 100 ug via INTRAVENOUS

## 2019-12-29 MED ORDER — SODIUM CHLORIDE 0.9 % IV SOLN: 250.0000 mL | INTRAVENOUS | Status: DC

## 2019-12-29 MED ORDER — PROPOFOL 10 MG/ML IV BOLUS
INTRAVENOUS | Status: AC
  Filled 2019-12-29: qty 20

## 2019-12-29 MED ORDER — POLYETHYLENE GLYCOL 3350 17 G PO PACK: 17.0000 g | PACK | Freq: Every day | ORAL | Status: DC | PRN

## 2019-12-29 MED ORDER — OXYCODONE HCL 5 MG PO TABS
5.0000 mg | ORAL_TABLET | ORAL | Status: DC | PRN
  Administered 2019-12-29 – 2019-12-30 (×5): 10 mg via ORAL
  Filled 2019-12-29 (×5): qty 2

## 2019-12-29 MED ORDER — MIDAZOLAM HCL 2 MG/2ML IJ SOLN
INTRAMUSCULAR | Status: AC
  Filled 2019-12-29: qty 2

## 2019-12-29 MED ORDER — CEFAZOLIN SODIUM-DEXTROSE 2-4 GM/100ML-% IV SOLN
INTRAVENOUS | Status: AC
  Filled 2019-12-29: qty 100

## 2019-12-29 MED ORDER — FLEET ENEMA 7-19 GM/118ML RE ENEM: 1.0000 | ENEMA | Freq: Once | RECTAL | Status: DC | PRN

## 2019-12-29 MED ORDER — LIDOCAINE-EPINEPHRINE 1 %-1:100000 IJ SOLN
INTRAMUSCULAR | Status: DC | PRN
  Administered 2019-12-29: 5 mL

## 2019-12-29 MED ORDER — ACETAMINOPHEN 10 MG/ML IV SOLN
INTRAVENOUS | Status: AC
  Filled 2019-12-29: qty 100

## 2019-12-29 MED ORDER — HYDROMORPHONE HCL 1 MG/ML IJ SOLN
1.0000 mg | INTRAMUSCULAR | Status: DC | PRN
  Administered 2019-12-29 – 2019-12-30 (×3): 1 mg via INTRAVENOUS
  Filled 2019-12-29 (×2): qty 1

## 2019-12-29 MED ORDER — THROMBIN 5000 UNITS EX SOLR
CUTANEOUS | Status: AC
  Filled 2019-12-29: qty 5000

## 2019-12-29 MED ORDER — MIDAZOLAM HCL 5 MG/5ML IJ SOLN
INTRAMUSCULAR | Status: DC | PRN
  Administered 2019-12-29: 2 mg via INTRAVENOUS

## 2019-12-29 MED ORDER — ACETAMINOPHEN 650 MG RE SUPP: 650.0000 mg | RECTAL | Status: DC | PRN

## 2019-12-29 MED ORDER — PHENYLEPHRINE 40 MCG/ML (10ML) SYRINGE FOR IV PUSH (FOR BLOOD PRESSURE SUPPORT)
PREFILLED_SYRINGE | INTRAVENOUS | Status: AC
  Filled 2019-12-29: qty 10

## 2019-12-29 MED ORDER — MENTHOL 3 MG MT LOZG: 1.0000 | LOZENGE | OROMUCOSAL | Status: DC | PRN

## 2019-12-29 MED ORDER — BUPIVACAINE HCL (PF) 0.5 % IJ SOLN
INTRAMUSCULAR | Status: DC | PRN
  Administered 2019-12-29: 5 mL

## 2019-12-29 MED ORDER — CEFAZOLIN SODIUM-DEXTROSE 2-4 GM/100ML-% IV SOLN
2.0000 g | Freq: Three times a day (TID) | INTRAVENOUS | Status: AC
  Administered 2019-12-29 (×2): 2 g via INTRAVENOUS
  Filled 2019-12-29 (×2): qty 100

## 2019-12-29 MED ORDER — EPHEDRINE 5 MG/ML INJ
INTRAVENOUS | Status: AC
  Filled 2019-12-29: qty 10

## 2019-12-29 MED ORDER — CHLORHEXIDINE GLUCONATE CLOTH 2 % EX PADS: 6.0000 | MEDICATED_PAD | Freq: Once | CUTANEOUS | Status: DC

## 2019-12-29 MED ORDER — CEFAZOLIN SODIUM-DEXTROSE 2-4 GM/100ML-% IV SOLN
2.0000 g | INTRAVENOUS | Status: AC
  Administered 2019-12-29: 2 g via INTRAVENOUS

## 2019-12-29 MED ORDER — EPHEDRINE SULFATE 50 MG/ML IJ SOLN
INTRAMUSCULAR | Status: DC | PRN
  Administered 2019-12-29 (×4): 5 mg via INTRAVENOUS

## 2019-12-29 MED ORDER — DEXAMETHASONE SODIUM PHOSPHATE 10 MG/ML IJ SOLN
INTRAMUSCULAR | Status: AC
  Filled 2019-12-29: qty 1

## 2019-12-29 MED ORDER — ROCURONIUM BROMIDE 50 MG/5ML IV SOSY
PREFILLED_SYRINGE | INTRAVENOUS | Status: DC | PRN
  Administered 2019-12-29: 15 mg via INTRAVENOUS
  Administered 2019-12-29 (×2): 20 mg via INTRAVENOUS
  Administered 2019-12-29: 15 mg via INTRAVENOUS
  Administered 2019-12-29: 10 mg via INTRAVENOUS
  Administered 2019-12-29: 50 mg via INTRAVENOUS

## 2019-12-29 MED ORDER — ONDANSETRON HCL 4 MG/2ML IJ SOLN: 4.0000 mg | Freq: Four times a day (QID) | INTRAMUSCULAR | Status: DC | PRN

## 2019-12-29 MED ORDER — SODIUM CHLORIDE 0.9% FLUSH
3.0000 mL | Freq: Two times a day (BID) | INTRAVENOUS | Status: DC
  Administered 2019-12-29: 3 mL via INTRAVENOUS

## 2019-12-29 MED ORDER — BUPIVACAINE HCL (PF) 0.5 % IJ SOLN
INTRAMUSCULAR | Status: AC
  Filled 2019-12-29: qty 30

## 2019-12-29 MED ORDER — PANTOPRAZOLE SODIUM 40 MG PO TBEC
40.0000 mg | DELAYED_RELEASE_TABLET | Freq: Every day | ORAL | Status: DC
  Administered 2019-12-29: 40 mg via ORAL
  Filled 2019-12-29: qty 1

## 2019-12-29 MED ORDER — LIDOCAINE-EPINEPHRINE 1 %-1:100000 IJ SOLN
INTRAMUSCULAR | Status: AC
  Filled 2019-12-29: qty 1

## 2019-12-29 MED ORDER — GABAPENTIN 300 MG PO CAPS
600.0000 mg | ORAL_CAPSULE | Freq: Every day | ORAL | Status: DC
  Administered 2019-12-29: 600 mg via ORAL
  Filled 2019-12-29: qty 2

## 2019-12-29 MED ORDER — THROMBIN 5000 UNITS EX SOLR
OROMUCOSAL | Status: DC | PRN
  Administered 2019-12-29: 5 mL via TOPICAL

## 2019-12-29 MED ORDER — ROCURONIUM BROMIDE 10 MG/ML (PF) SYRINGE
PREFILLED_SYRINGE | INTRAVENOUS | Status: AC
  Filled 2019-12-29: qty 20

## 2019-12-29 MED ORDER — SUGAMMADEX SODIUM 200 MG/2ML IV SOLN
INTRAVENOUS | Status: DC | PRN
  Administered 2019-12-29: 200 mg via INTRAVENOUS

## 2019-12-29 MED ORDER — ALUM & MAG HYDROXIDE-SIMETH 200-200-20 MG/5ML PO SUSP: 30.0000 mL | Freq: Four times a day (QID) | ORAL | Status: DC | PRN

## 2019-12-29 MED ORDER — ESTROGENS, CONJUGATED 0.625 MG/GM VA CREA: 0.5000 | TOPICAL_CREAM | VAGINAL | Status: DC

## 2019-12-29 MED ORDER — SODIUM CHLORIDE 0.9% FLUSH: 3.0000 mL | INTRAVENOUS | Status: DC | PRN

## 2019-12-29 MED ORDER — PANTOPRAZOLE SODIUM 40 MG IV SOLR: 40.0000 mg | Freq: Every day | INTRAVENOUS | Status: DC

## 2019-12-29 MED ORDER — HYDROCODONE-ACETAMINOPHEN 7.5-325 MG PO TABS
1.0000 | ORAL_TABLET | ORAL | Status: DC | PRN
  Administered 2019-12-29: 2 via ORAL
  Filled 2019-12-29: qty 2

## 2019-12-29 MED ORDER — METHOCARBAMOL 500 MG PO TABS
500.0000 mg | ORAL_TABLET | Freq: Four times a day (QID) | ORAL | Status: DC | PRN
  Administered 2019-12-29 – 2019-12-30 (×3): 500 mg via ORAL
  Filled 2019-12-29 (×3): qty 1

## 2019-12-29 MED ORDER — METHOCARBAMOL 1000 MG/10ML IJ SOLN
500.0000 mg | Freq: Four times a day (QID) | INTRAVENOUS | Status: DC | PRN
  Filled 2019-12-29: qty 5

## 2019-12-29 MED ORDER — DOCUSATE SODIUM 100 MG PO CAPS
100.0000 mg | ORAL_CAPSULE | Freq: Two times a day (BID) | ORAL | Status: DC
  Administered 2019-12-29: 100 mg via ORAL
  Filled 2019-12-29: qty 1

## 2019-12-29 MED ORDER — ZOLPIDEM TARTRATE 5 MG PO TABS: 5.0000 mg | ORAL_TABLET | Freq: Every evening | ORAL | Status: DC | PRN

## 2019-12-29 MED ORDER — ACETAMINOPHEN 10 MG/ML IV SOLN
INTRAVENOUS | Status: DC | PRN
  Administered 2019-12-29: 1000 mg via INTRAVENOUS

## 2019-12-29 MED ORDER — BUPIVACAINE LIPOSOME 1.3 % IJ SUSP
20.0000 mL | Freq: Once | INTRAMUSCULAR | Status: AC
  Administered 2019-12-29: 20 mL
  Filled 2019-12-29: qty 20

## 2019-12-29 MED ORDER — DEXAMETHASONE SODIUM PHOSPHATE 10 MG/ML IJ SOLN
INTRAMUSCULAR | Status: DC | PRN
  Administered 2019-12-29: 10 mg via INTRAVENOUS

## 2019-12-29 MED ORDER — PROPOFOL 10 MG/ML IV BOLUS
INTRAVENOUS | Status: DC | PRN
  Administered 2019-12-29: 130 mg via INTRAVENOUS

## 2019-12-29 MED ORDER — LACTATED RINGERS IV SOLN: INTRAVENOUS | Status: DC | PRN

## 2019-12-29 MED ORDER — OXYCODONE HCL 5 MG PO TABS: 5.0000 mg | ORAL_TABLET | Freq: Once | ORAL | Status: DC | PRN

## 2019-12-29 SURGICAL SUPPLY — 89 items
ADH SKN CLS APL DERMABOND .7 (GAUZE/BANDAGES/DRESSINGS) ×1
BASKET BONE COLLECTION (BASKET) ×3 IMPLANT
BLADE CLIPPER SURG (BLADE) IMPLANT
BONE CANC CHIPS 40CC CAN1/2 (Bone Implant) ×3 IMPLANT
BUR MATCHSTICK NEURO 3.0 LAGG (BURR) ×3 IMPLANT
BUR PRECISION FLUTE 5.0 (BURR) ×3 IMPLANT
CANISTER SUCT 3000ML PPV (MISCELLANEOUS) ×3 IMPLANT
CARTRIDGE OIL MAESTRO DRILL (MISCELLANEOUS) ×1 IMPLANT
CHIPS CANC BONE 40CC CAN1/2 (Bone Implant) ×1 IMPLANT
CONT SPEC 4OZ CLIKSEAL STRL BL (MISCELLANEOUS) ×3 IMPLANT
COVER BACK TABLE 60X90IN (DRAPES) ×3 IMPLANT
COVER WAND RF STERILE (DRAPES) ×1 IMPLANT
DECANTER SPIKE VIAL GLASS SM (MISCELLANEOUS) ×3 IMPLANT
DERMABOND ADVANCED (GAUZE/BANDAGES/DRESSINGS) ×2
DERMABOND ADVANCED .7 DNX12 (GAUZE/BANDAGES/DRESSINGS) ×1 IMPLANT
DIFFUSER DRILL AIR PNEUMATIC (MISCELLANEOUS) ×3 IMPLANT
DRAPE C-ARM 42X72 X-RAY (DRAPES) ×3 IMPLANT
DRAPE C-ARMOR (DRAPES) ×3 IMPLANT
DRAPE LAPAROTOMY 100X72X124 (DRAPES) ×3 IMPLANT
DRAPE SURG 17X23 STRL (DRAPES) ×3 IMPLANT
DRSG OPSITE POSTOP 4X6 (GAUZE/BANDAGES/DRESSINGS) ×2 IMPLANT
DURAPREP 26ML APPLICATOR (WOUND CARE) ×3 IMPLANT
ELECT BLADE 4.0 EZ CLEAN MEGAD (MISCELLANEOUS) ×3
ELECT REM PT RETURN 9FT ADLT (ELECTROSURGICAL) ×3
ELECTRODE BLDE 4.0 EZ CLN MEGD (MISCELLANEOUS) IMPLANT
ELECTRODE REM PT RTRN 9FT ADLT (ELECTROSURGICAL) ×1 IMPLANT
EVACUATOR 1/8 PVC DRAIN (DRAIN) IMPLANT
GAUZE 4X4 16PLY RFD (DISPOSABLE) ×2 IMPLANT
GAUZE SPONGE 4X4 12PLY STRL (GAUZE/BANDAGES/DRESSINGS) ×1 IMPLANT
GLOVE BIO SURGEON STRL SZ7 (GLOVE) ×4 IMPLANT
GLOVE BIO SURGEON STRL SZ8 (GLOVE) ×6 IMPLANT
GLOVE BIOGEL PI IND STRL 6.5 (GLOVE) IMPLANT
GLOVE BIOGEL PI IND STRL 7.0 (GLOVE) IMPLANT
GLOVE BIOGEL PI IND STRL 7.5 (GLOVE) IMPLANT
GLOVE BIOGEL PI IND STRL 8 (GLOVE) ×2 IMPLANT
GLOVE BIOGEL PI IND STRL 8.5 (GLOVE) ×2 IMPLANT
GLOVE BIOGEL PI INDICATOR 6.5 (GLOVE) ×2
GLOVE BIOGEL PI INDICATOR 7.0 (GLOVE) ×2
GLOVE BIOGEL PI INDICATOR 7.5 (GLOVE) ×2
GLOVE BIOGEL PI INDICATOR 8 (GLOVE) ×4
GLOVE BIOGEL PI INDICATOR 8.5 (GLOVE) ×4
GLOVE ECLIPSE 7.0 STRL STRAW (GLOVE) ×2 IMPLANT
GLOVE ECLIPSE 8.0 STRL XLNG CF (GLOVE) ×6 IMPLANT
GLOVE EXAM NITRILE XL STR (GLOVE) IMPLANT
GLOVE SURG SS PI 6.0 STRL IVOR (GLOVE) ×6 IMPLANT
GOWN STRL REUS W/ TWL LRG LVL3 (GOWN DISPOSABLE) IMPLANT
GOWN STRL REUS W/ TWL XL LVL3 (GOWN DISPOSABLE) ×3 IMPLANT
GOWN STRL REUS W/TWL 2XL LVL3 (GOWN DISPOSABLE) ×4 IMPLANT
GOWN STRL REUS W/TWL LRG LVL3 (GOWN DISPOSABLE) ×9
GOWN STRL REUS W/TWL XL LVL3 (GOWN DISPOSABLE) ×6
GRAFT BNE CHIP CANC 1-8 40 (Bone Implant) IMPLANT
HEMOSTAT POWDER KIT SURGIFOAM (HEMOSTASIS) ×3 IMPLANT
KIT BASIN OR (CUSTOM PROCEDURE TRAY) ×3 IMPLANT
KIT INFUSE SMALL (Orthopedic Implant) ×2 IMPLANT
KIT POSITION SURG JACKSON T1 (MISCELLANEOUS) ×3 IMPLANT
KIT TURNOVER KIT B (KITS) ×3 IMPLANT
MILL MEDIUM DISP (BLADE) ×2 IMPLANT
NDL HYPO 21X1.5 SAFETY (NEEDLE) IMPLANT
NDL HYPO 25X1 1.5 SAFETY (NEEDLE) ×1 IMPLANT
NDL SPNL 18GX3.5 QUINCKE PK (NEEDLE) IMPLANT
NEEDLE HYPO 21X1.5 SAFETY (NEEDLE) ×3 IMPLANT
NEEDLE HYPO 25X1 1.5 SAFETY (NEEDLE) ×3 IMPLANT
NEEDLE SPNL 18GX3.5 QUINCKE PK (NEEDLE) ×3 IMPLANT
NS IRRIG 1000ML POUR BTL (IV SOLUTION) ×3 IMPLANT
OIL CARTRIDGE MAESTRO DRILL (MISCELLANEOUS) ×3
PACK LAMINECTOMY NEURO (CUSTOM PROCEDURE TRAY) ×3 IMPLANT
PAD ARMBOARD 7.5X6 YLW CONV (MISCELLANEOUS) ×11 IMPLANT
PATTIES SURGICAL .5 X.5 (GAUZE/BANDAGES/DRESSINGS) IMPLANT
PATTIES SURGICAL .5 X1 (DISPOSABLE) IMPLANT
PATTIES SURGICAL 1X1 (DISPOSABLE) IMPLANT
ROD RELINE-O LORDOTIC 5.5X60MM (Rod) ×4 IMPLANT
SCREW LOCK RELINE 5.5 TULIP (Screw) ×12 IMPLANT
SCREW RELINE-O POLY 6.5X45 (Screw) ×4 IMPLANT
SCREW RELINE-O POLY 6.5X50MM (Screw) ×4 IMPLANT
SCREW RELINE-O POLY 7.5X40 (Screw) ×2 IMPLANT
SCREW RELINE-O POLY 7.5X45 (Screw) ×2 IMPLANT
SPONGE LAP 4X18 RFD (DISPOSABLE) IMPLANT
SPONGE SURGIFOAM ABS GEL 100 (HEMOSTASIS) IMPLANT
STAPLER SKIN PROX WIDE 3.9 (STAPLE) IMPLANT
SUT VIC AB 1 CT1 18XBRD ANBCTR (SUTURE) ×2 IMPLANT
SUT VIC AB 1 CT1 8-18 (SUTURE) ×6
SUT VIC AB 2-0 CT1 18 (SUTURE) ×4 IMPLANT
SUT VIC AB 3-0 SH 8-18 (SUTURE) ×6 IMPLANT
SYR 20ML LL LF (SYRINGE) ×2 IMPLANT
SYR 5ML LL (SYRINGE) IMPLANT
TOWEL GREEN STERILE (TOWEL DISPOSABLE) ×3 IMPLANT
TOWEL GREEN STERILE FF (TOWEL DISPOSABLE) ×3 IMPLANT
TRAY FOLEY MTR SLVR 16FR STAT (SET/KITS/TRAYS/PACK) ×3 IMPLANT
WATER STERILE IRR 1000ML POUR (IV SOLUTION) ×3 IMPLANT

## 2019-12-29 NOTE — Interval H&P Note (Signed)
History and Physical Interval Note:  12/29/2019 7:18 AM  Kendra Mann  has presented today for surgery, with the diagnosis of Degenerative lumbar spinal stenosis.  The various methods of treatment have been discussed with the patient and family. After consideration of risks, benefits and other options for treatment, the patient has consented to  Procedure(s) with comments: Lumbar 4 to Sacral 1 Posterior lumbar fusion with Lumbar 4-5 decompression (N/A) - Lumbar 4 to Sacral 1 Posterior lumbar fusion with Lumbar 4-5 decompression as a surgical intervention.  The patient's history has been reviewed, patient examined, no change in status, stable for surgery.  I have reviewed the patient's chart and labs.  Questions were answered to the patient's satisfaction.     [name]

## 2019-12-29 NOTE — H&P (Signed)
Patient ID:   [id number] Patient: Kendra Mann  Date of Birth: 1966-08-15 Visit Type: Office Visit   Date: 12/16/2019 12:30 PM Provider: Marchia Meiers. Vertell Limber MD   This 54 year old female presents for back pain.  HISTORY OF PRESENT ILLNESS:  1.  back pain  Patient visits after calling to report persistent severe pain and numbness left groin and thigh.  She reports a "bandlike sensation" around her left hip and thigh.  She recalls falling down stairs December 31st, landing on her left hip and buttock.  Pain increased significantly for the 3 days following, during which time she increased her Norco to 3 (10/325) tabs every 4-6 hours.  She has since been able to decrease her Norco to 1 tab every 4-6 hours in an effort to make the prescription last.  She is now out of pain medication.  She also notes suffering a fracture 3rd toe left foot when a frozen food item fell out of the freezer.  Gabapentin 300 mg at bedtime Robaxin 500 mg at bedtime Norco 10/325 typically 1 every 4-6 hours  X-rays on canopy  The patient describes that her pain is much worse.  She said it was bad until she had the injections on 12/28 but they wore off on 12/09/2019.  She says they did not last for long enough.  She is complaining of her left upper thigh pain and says that on 01/01 and 12/12/2019 the pain was much more severe and was "horrible" she currently grades her pain as 9/10 in severity.  I reviewed the patient's x-rays and believe that she has gone on to have worsening spinal stenosis at the L4-5 level.  I have recommended repeat MRI of the lumbar spine to make sure there has been no other significant interval change and then proceeding with L4 through S1 decompression and fusion posteriorly.  The patient has bruising of her left 3rd toe which she fractured.         Medical/Surgical/Interim History Reviewed, no change.  Last detailed document date:10/14/2018.     PAST MEDICAL HISTORY, SURGICAL HISTORY,  FAMILY HISTORY, SOCIAL HISTORY AND REVIEW OF SYSTEMS I have reviewed the patient's past medical, surgical, family and social history as well as the comprehensive review of systems as included on the [facility] history form dated 08/26/2019, which I have signed.  Family History:  Reviewed, no changes.  Last detailed document date:08/19/2018.   Social History: Reviewed, no changes. Last detailed document date: 12/07/2019.    MEDICATIONS: (added, continued or stopped this visit) Started Medication Directions Instruction Stopped  12/09/2019 hydrocodone 10 mg-acetaminophen 325 mg tablet take 1 tablet by oral route  every 6 hours as needed for pain  12/16/2019  12/16/2019 hydrocodone 10 mg-acetaminophen 325 mg tablet take 1 tablet by oral route  every 6 hours as needed for pain    10/12/2019 ibuprofen 800 mg tablet take 1 tablet by oral route 3 times every day with food as needed    08/25/2019 METHOCARBAMOL 500 MG TABLET TAKE 1 TABLET BY MOUTH FOUR TIMES A DAY AS NEEDED    06/22/2019 Neurontin 300 mg capsule Take 1-2 po tid.       ALLERGIES: Ingredient Reaction Medication Name Comment  NO KNOWN ALLERGIES     No known allergies. Reviewed, no changes.    PHYSICAL EXAM:   Vitals Date Temp F BP Pulse Ht In Wt Lb BMI BSA Pain Score  12/16/2019 97.3 123/83 88 66 216.8 34.99  9/10  IMPRESSION:   Patient is continued to have severe back and lower extremity pain, worse on the left.  I have recommended proceeding with decompression and fusion L4 through S1 levels.  I believe this needs to be done a expeditiously because of the severity of the patient's pain  PLAN:  Plan for decompression fusion L4 through sacral levels.  Prescription was given for LSO brace.  Nurse teaching was performed.  We went over the specifics of the surgical procedure in great detail.  Orders: Diagnostic Procedures: Assessment Procedure  M25.552 Hip - Left Lateral W/AP Pelvis   M48.061 MRI Spine/lumb With & W/o Contrast  M54.16 Lumbar Spine- AP/Lat  Instruction(s)/Education: Assessment Instruction  R03.0 Lifestyle education  Z68.34 Dietary management education, guidance, and counseling  Miscellaneous: Assessment   M48.061 LSO Brace   Completed Orders (this encounter) Order Details Reason Side Interpretation Result Initial Treatment Date Region  Hip - Left Lateral W/AP Pelvis      12/16/2019   Lifestyle education Patient will monitor and contact primary care physician if needed.        Dietary management education, guidance, and counseling Encouraged patient to eat well balanced diet.         Assessment/Plan   # Detail Type Description   1. Assessment Degenerative lumbar spinal stenosis (M48.061).   Plan Orders LSO Brace.       2. Assessment Lumbar spondylosis (M47.816).       3. Assessment Low back pain (M54.5).       4. Assessment Radiculopathy, lumbar region (M54.16).       5. Assessment Left hip pain (M25.552).       6. Assessment Elevated blood-pressure reading, w/o diagnosis of htn (R03.0).       7. Assessment Body mass index (BMI) 34.0-34.9, adult (Z68.34).   Plan Orders Today's instructions / counseling include(s) Dietary management education, guidance, and counseling. Clinical information/comments: Encouraged patient to eat well balanced diet.         Pain Management Plan Pain Scale: 9/10. Method: Numeric Pain Intensity Scale. Location: back. Onset: 12/19/2017. Duration: varies. Quality: discomforting. Pain management follow-up plan of care: Patient will continue medication management.Marland Kitchen     MEDICATIONS PRESCRIBED TODAY    Rx Quantity Refills  HYDROCODONE-ACETAMINOPHEN 10 mg-325 mg  60 0            Provider:  Danae Orleans. Venetia Maxon MD  12/20/2019 03:39 PM    Dictation edited by: Danae Orleans. Venetia Maxon    CC Providers: Darryl Lent [facility] [place],  [place]  [id number]-   Darryl Lent  [facility] [place], [place] [address]-               Electronically signed by Danae Orleans. Venetia Maxon MD on 12/20/2019 03:39 PM

## 2019-12-29 NOTE — Brief Op Note (Signed)
12/29/2019  10:47 AM  PATIENT:  Kendra Mann  54 y.o. female  PRE-OPERATIVE DIAGNOSIS:  Degenerative lumbar spinal stenosis, lumbar five fracture, lumbar instability, radiculopathy, lumbago  POST-OPERATIVE DIAGNOSIS: Degenerative lumbar spinal stenosis, lumbar five fracture, lumbar instability, radiculopathy, lumbago   PROCEDURE:  Procedure(s) with comments: Lumbar Four to Sacral One Posterior lumbar  fusion with Lumbar Four-Five decompression (N/A) - posterior with segmental pedicle screw fixation and posterolateral arthrodesis  SURGEON:  Surgeon(s) and Role:    Maeola Harman, MD - Primary    * Lisbeth Renshaw, MD - Assisting  PHYSICIAN ASSISTANT:   ASSISTANTS: Poteat, RN   ANESTHESIA:   general  EBL:  200 mL   BLOOD ADMINISTERED:none  DRAINS: none   LOCAL MEDICATIONS USED:  MARCAINE    and LIDOCAINE   SPECIMEN:  No Specimen  DISPOSITION OF SPECIMEN:  N/A  COUNTS:  YES  TOURNIQUET:  * No tourniquets in log *  DICTATION: Patient is 54 year old woman with prior ALIF of L 4 and L 5 who developed subsidence and fracture of L 5 with resultant symptomatic lumbar stenosis. She has a severe left L5 radiculopathy. It was elected to take her to surgery for decompression and fusion at L 4 - S 1 levels.   Procedure: Patient was placed in a prone position on the [place] table after smooth and uncomplicated induction of general endotracheal anesthesia. Her low back was prepped and draped in usual sterile fashion with betadine scrub and DuraPrep after marking the pedicles of L 4, L 5, S 1 with Carm. Area of incision was infiltrated with local lidocaine. Incision was made to the lumbodorsal fascia was incised and exposure was performed of the L 4 through S 1 spinous processes laminae facet joint and transverse processes. Intraoperative x-ray was obtained which confirmed correct orientation. A total laminectomy of L4 and L 5 was performed with thorough decompression was performed of  both L4 and L 5 and S 1 nerve roots along with the common dural tube. This decompression was more involved than would be typical of that performed for PLIF alone and included painstaking dissection of adherent ligament compressing the thecal sac and wide decompression of all neural elements.  The posterolateral region was extensively decorticated and pedicle probes were placed at L4, L5 and S 1 bilaterally. Intraoperative fluoroscopy confirmed correct orientationin the AP and lateral plane. 45 x 6.5 mm pedicle screws were placed at L5 bilaterally and 50 x 6.5 mm screws placed at L4 bilaterally and bicortical screws at S 1 (45 x 7.5 mm on the right and 7.5 x 40 mm screw on the left) final x-rays demonstrated well-positioned interbody grafts and pedicle screw fixation. A 60 mm lordotic rod was placed on the right and a 60 mm rod was placed on the left locked down in situ and the posterolateral region was packed with the small BMP, 10 cc autograft and 20 cc  and bone allograft bilaterally. The wound was irrigated. Long-acting Marcaine was injected in the deep musculature.  Fascia was closed with 1 Vicryl sutures skin edges were reapproximated 2 and 3-0 Vicryl sutures. The wound is dressed with Dermabond and an occlusive dressing.   The patient was extubated in the operating room and taken to recovery in stable satisfactory condition. She tolerated the operation well counts were correct at the end of the case.   PLAN OF CARE: Admit to inpatient   PATIENT DISPOSITION:  PACU - hemodynamically stable.   Delay start of Pharmacological VTE agent (>  24hrs) due to surgical blood loss or risk of bleeding: yes

## 2019-12-29 NOTE — Op Note (Signed)
12/29/2019  10:47 AM  PATIENT:  Kendra Mann  54 y.o. female  PRE-OPERATIVE DIAGNOSIS:  Degenerative lumbar spinal stenosis, lumbar five fracture, lumbar instability, radiculopathy, lumbago  POST-OPERATIVE DIAGNOSIS: Degenerative lumbar spinal stenosis, lumbar five fracture, lumbar instability, radiculopathy, lumbago   PROCEDURE:  Procedure(s) with comments: Lumbar Four to Sacral One Posterior lumbar  fusion with Lumbar Four-Five decompression (N/A) - posterior with segmental pedicle screw fixation and posterolateral arthrodesis  SURGEON:  Surgeon(s) and Role:    * Terren Haberle, MD - Primary    * Nundkumar, Neelesh, MD - Assisting  PHYSICIAN ASSISTANT:   ASSISTANTS: Poteat, RN   ANESTHESIA:   general  EBL:  200 mL   BLOOD ADMINISTERED:none  DRAINS: none   LOCAL MEDICATIONS USED:  MARCAINE    and LIDOCAINE   SPECIMEN:  No Specimen  DISPOSITION OF SPECIMEN:  N/A  COUNTS:  YES  TOURNIQUET:  * No tourniquets in log *  DICTATION: Patient is 54-year-old woman with prior ALIF of L 4 and L 5 who developed subsidence and fracture of L 5 with resultant symptomatic lumbar stenosis. She has a severe left L5 radiculopathy. It was elected to take her to surgery for decompression and fusion at L 4 - S 1 levels.   Procedure: Patient was placed in a prone position on the [place] table after smooth and uncomplicated induction of general endotracheal anesthesia. Her low back was prepped and draped in usual sterile fashion with betadine scrub and DuraPrep after marking the pedicles of L 4, L 5, S 1 with Carm. Area of incision was infiltrated with local lidocaine. Incision was made to the lumbodorsal fascia was incised and exposure was performed of the L 4 through S 1 spinous processes laminae facet joint and transverse processes. Intraoperative x-ray was obtained which confirmed correct orientation. A total laminectomy of L4 and L 5 was performed with thorough decompression was performed of  both L4 and L 5 and S 1 nerve roots along with the common dural tube. This decompression was more involved than would be typical of that performed for PLIF alone and included painstaking dissection of adherent ligament compressing the thecal sac and wide decompression of all neural elements.  The posterolateral region was extensively decorticated and pedicle probes were placed at L4, L5 and S 1 bilaterally. Intraoperative fluoroscopy confirmed correct orientationin the AP and lateral plane. 45 x 6.5 mm pedicle screws were placed at L5 bilaterally and 50 x 6.5 mm screws placed at L4 bilaterally and bicortical screws at S 1 (45 x 7.5 mm on the right and 7.5 x 40 mm screw on the left) final x-rays demonstrated well-positioned interbody grafts and pedicle screw fixation. A 60 mm lordotic rod was placed on the right and a 60 mm rod was placed on the left locked down in situ and the posterolateral region was packed with the small BMP, 10 cc autograft and 20 cc  and bone allograft bilaterally. The wound was irrigated. Long-acting Marcaine was injected in the deep musculature.  Fascia was closed with 1 Vicryl sutures skin edges were reapproximated 2 and 3-0 Vicryl sutures. The wound is dressed with Dermabond and an occlusive dressing.   The patient was extubated in the operating room and taken to recovery in stable satisfactory condition. She tolerated the operation well counts were correct at the end of the case.   PLAN OF CARE: Admit to inpatient   PATIENT DISPOSITION:  PACU - hemodynamically stable.   Delay start of Pharmacological VTE agent (>  24hrs) due to surgical blood loss or risk of bleeding: yes

## 2019-12-29 NOTE — Progress Notes (Signed)
Awake, alert, conversant.  Full strength both legs.  No numbness.  Doing well.

## 2019-12-29 NOTE — Anesthesia Procedure Notes (Signed)
Procedure Name: Intubation Date/Time: 12/29/2019 7:45 AM Performed by: Inda Coke, CRNA Pre-anesthesia Checklist: Patient identified, Emergency Drugs available, Suction available and Patient being monitored Patient Re-evaluated:Patient Re-evaluated prior to induction Oxygen Delivery Method: Circle System Utilized Preoxygenation: Pre-oxygenation with 100% oxygen Induction Type: IV induction Ventilation: Mask ventilation without difficulty Laryngoscope Size: Mac and 3 Grade View: Grade I Tube type: Oral Tube size: 7.5 mm Number of attempts: 1 Airway Equipment and Method: Stylet and Oral airway Placement Confirmation: ETT inserted through vocal cords under direct vision,  positive ETCO2 and breath sounds checked- equal and bilateral Secured at: 22 cm Tube secured with: Tape Dental Injury: Teeth and Oropharynx as per pre-operative assessment

## 2019-12-29 NOTE — Transfer of Care (Signed)
Immediate Anesthesia Transfer of Care Note  Patient: Kendra Mann  Procedure(s) Performed: Lumbar Four to Sacral One Posterior lumbar  fusion with Lumbar Four-Five decompression (N/A Spine Lumbar)  Patient Location: PACU  Anesthesia Type:General  Level of Consciousness: awake, alert  and oriented  Airway & Oxygen Therapy: Patient Spontanous Breathing and Patient connected to nasal cannula oxygen  Post-op Assessment: Report given to RN, Post -op Vital signs reviewed and stable and Patient moving all extremities X 4  Post vital signs: Reviewed and stable  Last Vitals:  Vitals Value Taken Time  BP 103/35 12/29/19 1051  Temp 36.1 C 12/29/19 1051  Pulse 72 12/29/19 1055  Resp 10 12/29/19 1055  SpO2 95 % 12/29/19 1055  Vitals shown include unvalidated device data.  Last Pain:  Vitals:   12/29/19 0647  TempSrc:   PainSc: 6          Complications: No apparent anesthesia complications

## 2019-12-29 NOTE — Anesthesia Postprocedure Evaluation (Signed)
Anesthesia Post Note  Patient: Kendra Mann  Procedure(s) Performed: Lumbar Four to Sacral One Posterior lumbar  fusion with Lumbar Four-Five decompression (N/A Spine Lumbar)     Patient location during evaluation: PACU Anesthesia Type: General Level of consciousness: awake and alert Pain management: pain level controlled Vital Signs Assessment: post-procedure vital signs reviewed and stable Respiratory status: spontaneous breathing, nonlabored ventilation, respiratory function stable and patient connected to nasal cannula oxygen Cardiovascular status: blood pressure returned to baseline and stable Postop Assessment: no apparent nausea or vomiting Anesthetic complications: no    Last Vitals:  Vitals:   12/29/19 1156 12/29/19 1257  BP: (!) 88/65 100/68  Pulse: 73 63  Resp: 18   Temp:    SpO2: 98%                   Shelton Silvas

## 2019-12-29 NOTE — Anesthesia Preprocedure Evaluation (Addendum)
Anesthesia Evaluation  Patient identified by MRN, date of birth, ID band Patient awake    Reviewed: Allergy & Precautions, NPO status , Patient's Chart, lab work & pertinent test results  Airway Mallampati: III  TM Distance: >3 FB Neck ROM: Full    Dental  (+) Teeth Intact, Dental Advisory Given   Pulmonary neg pulmonary ROS,    Pulmonary exam normal        Cardiovascular negative cardio ROS Normal cardiovascular exam     Neuro/Psych  Neuromuscular disease negative psych ROS   GI/Hepatic negative GI ROS, Neg liver ROS,   Endo/Other  negative endocrine ROS  Renal/GU negative Renal ROS     Musculoskeletal  (+) Arthritis ,   Abdominal   Peds  Hematology negative hematology ROS (+)   Anesthesia Other Findings   Reproductive/Obstetrics                            Lab Results  Component Value Date   WBC 9.0 12/25/2019   HGB 13.1 12/25/2019   HCT 39.9 12/25/2019   MCV 100.3 (H) 12/25/2019   PLT 251 12/25/2019   Lab Results  Component Value Date   CREATININE 0.84 12/26/2018   BUN 12 12/26/2018   NA 137 12/26/2018   K 3.9 12/26/2018   CL 103 12/26/2018   CO2 24 12/26/2018   No results found for: INR, PROTIME   Anesthesia Physical Anesthesia Plan  ASA: II  Anesthesia Plan: General   Post-op Pain Management:    Induction: Intravenous  PONV Risk Score and Plan: 4 or greater and Ondansetron, Dexamethasone, Midazolam, Treatment may vary due to age or medical condition and Scopolamine patch - Pre-op  Airway Management Planned: Oral ETT  Additional Equipment: None  Intra-op Plan:   Post-operative Plan: Extubation in OR  Informed Consent: I have reviewed the patients History and Physical, chart, labs and discussed the procedure including the risks, benefits and alternatives for the proposed anesthesia with the patient or authorized representative who has indicated his/her  understanding and acceptance.       Plan Discussed with: CRNA  Anesthesia Plan Comments:        Anesthesia Quick Evaluation

## 2019-12-30 MED ORDER — METHOCARBAMOL 500 MG PO TABS: 500.0000 mg | ORAL_TABLET | Freq: Four times a day (QID) | ORAL | 1 refills | Status: AC | PRN

## 2019-12-30 MED ORDER — OXYCODONE HCL 10 MG PO TABS: 10.0000 mg | ORAL_TABLET | ORAL | 0 refills | Status: DC | PRN

## 2019-12-30 NOTE — Discharge Instructions (Signed)
Wound Care Leave incision open to air. You may shower. Do not scrub directly on incision.  Do not put any creams, lotions, or ointments on incision. Activity Walk each and every day, increasing distance each day. No lifting greater than 5 lbs.  Avoid bending, arching, and twisting. No driving for 2 weeks; may ride as a passenger locally. If provided with back brace, wear when out of bed.  It is not necessary to wear in bed. Diet Resume your normal diet.  Return to Work Will be discussed at you follow up appointment. Call Your Doctor If Any of These Occur Redness, drainage, or swelling at the wound.  Temperature greater than 101 degrees. Severe pain not relieved by pain medication. Incision starts to come apart. Follow Up Appt Call today for appointment in 3-4 weeks ([phone]) or for problems.  If you have any hardware placed in your spine, you will need an x-ray before your appointment.   Spinal Fusion, Adult, Care After This sheet gives you information about how to care for yourself after your procedure. Your doctor may also give you more specific instructions. If you have problems or questions, contact your doctor. Follow these instructions at home: Medicines  Take over-the-counter and prescription medicines only as told by your doctor. These include any medicines for pain or blood-thinning medicines (anticoagulants).  If you were prescribed an antibiotic medicine, take it as told by your doctor. Do not stop taking the antibiotic even if you start to feel better.  Do not drive for 24 hours if you were given a medicine to help you relax (sedative) during your procedure.  Do not drive or use heavy machinery while taking prescription pain medicine. If you have a brace:  Wear the brace as told by your doctor. Take it off only as told by your doctor.  Keep the brace clean. Managing pain, stiffness, and swelling  If directed, put ice on the surgery area: ? If you have a  removable brace, take it off as told by your doctor. ? Put ice in a plastic bag. ? Place a towel between your skin and the bag. ? Leave the ice on for 20 minutes, 2-3 times a day. Surgery cut care   Follow instructions from your doctor about how to take care of your cut from surgery (incision). Make sure you: ? Wash your hands with soap and water before you change your bandage (dressing). If you cannot use soap and water, use hand sanitizer. ? Change your bandage as told by your doctor. ? Leave stitches (sutures), skin glue, or skin tape (adhesive) strips in place. They may need to stay in place for 2 weeks or longer. If tape strips get loose and curl up, you may trim the loose edges. Do not remove tape strips completely unless your doctor says it is okay.  Keep your cut from surgery clean and dry. ? Do not take baths, swim, or use a hot tub until your doctor says it is okay. ? Ask your doctor if you can take showers. You may only be allowed to take sponge baths.  Every day, check your cut from surgery and the area around it for: ? More redness, swelling, or pain. ? Fluid or blood. ? Warmth. ? Pus or a bad smell.  If you have a drain tube, follow instructions from your doctor about caring for it. Do not take out the drain tube or any bandages unless your doctor says it is okay. Physical activity  Rest  and protect your back as much as possible.  Follow instructions from your doctor about how to move. Use good posture to help your spine heal.  Do not lift anything that is heavier than 8 lb (3.6 kg), or the limit that you are told, until your doctor says that it is safe.  Do not twist or bend at the waist until your doctor says it is okay.  It is best if you: ? Do not make pushing and pulling motions. ? Do not sit or lie down in the same position for a long time. ? Do not raise your hands or arms above your head.  Return to your normal activities as told by your doctor. Ask your  doctor what activities are safe for you. Rest and protect your back as much as you can.  Do not start to exercise until your doctor says it is okay. Ask your doctor what kinds of exercise you can do to make your back stronger. General instructions  To prevent blood clots and lessen swelling in your legs: ? Wear compression stockings as told. ? Walk one or more times every few hours as told by your doctor.  Do not use any products that contain nicotine or tobacco, such as cigarettes and e-cigarettes. These can delay bone healing. If you need help quitting, ask your doctor.  To prevent or treat constipation while you are taking prescription pain medicine, your doctor may suggest that you: ? Drink enough fluid to keep your pee (urine) pale yellow. ? Take over-the-counter or prescription medicines. ? Eat foods that are high in fiber. These include fresh fruits and vegetables, whole grains, and beans. ? Limit foods that are high in fat and processed sugars, such as fried and sweet foods.  Keep all follow-up visits as told by your doctor. This is important. Contact a doctor if:  Your pain gets worse.  Your medicine does not help your pain.  Your legs or feet get painful or swollen.  Your cut from surgery is more red, swollen, or painful.  Your cut from surgery feels warm to the touch.  You have: ? Fluid or blood coming from your cut from surgery. ? Pus or a bad smell coming from your cut from surgery. ? A fever. ? Weakness or loss of feeling (numbness) in your legs that is new or getting worse. ? Trouble controlling when you pee (urinate) or poop (have a bowel movement).  You feel sick to your stomach (nauseous).  You throw up (vomit). Get help right away if:  Your pain is very bad.  You have chest pain.  You have trouble breathing.  You start to have a cough. These symptoms may be an emergency. Do not wait to see if the symptoms will go away. Get medical help right away.  Call your local emergency services (911 in the [place] Do not drive yourself to the hospital. Summary  After the procedure, it is common to have pain in your back and pain by your surgery cut(s).  Icing and pain medicines may help to control the pain. Follow directions from your doctor.  Rest and protect your back as much as possible. Do not twist or bend at the waist.  Get up and walk one or more times every few hours as told by your doctor. This information is not intended to replace advice given to you by your health care provider. Make sure you discuss any questions you have with your health care provider. Document  Revised: 03/19/2019 Document Reviewed: 03/12/2017 Elsevier Patient Education  2020 [employer].

## 2019-12-30 NOTE — Plan of Care (Signed)
Pt doing well. Pt given D/C instructions with verbal understanding. Rx's were sent to pharmacy by MD. Pt's incision is clean and dry with no sign of infection. Pt's IV was removed prior to D/C. Pt D/C'd home via wheelchair per MD order. Pt is stable @ D/C and has no other needs at this time. Karie Skowron, RN  

## 2019-12-30 NOTE — Discharge Summary (Signed)
Physician Discharge Summary  Patient ID: Kendra Mann MRN: [id number] DOB/AGE: 1966/09/01 54 y.o.  Admit date: 12/29/2019 Discharge date: 12/30/2019  Admission Diagnoses:Spinal stenosis, L 5 fracture, lumbago, radiculopathy  Discharge Diagnoses: Spinal stenosis, L 5 fracture, lumbago, radiculopathy Active Problems:   Spinal stenosis of lumbar region with radiculopathy   Discharged Condition: good  Hospital Course: Patient underwent posterior decompression with fusion L 4 - S 1 level.  She did well postoperatively and was discharged home AM of POD 1.  Consults: None  Significant Diagnostic Studies: None  Treatments: surgery:  Patient underwent posterior decompression with fusion L 4 - S 1 level  Discharge Exam: Blood pressure 100/72, pulse 79, temperature 97.7 F (36.5 C), temperature source Oral, resp. rate 18, height 5\' 6"  (1.676 m), weight 98.6 kg, SpO2 100 %. Neurologic: Alert and oriented X 3, normal strength and tone. Normal symmetric reflexes. Normal coordination and gait Wound:CDi  Disposition: Home  Discharge Instructions    Diet - low sodium heart healthy   Complete by: As directed    Increase activity slowly   Complete by: As directed      Allergies as of 12/30/2019   No Known Allergies     Medication List    STOP taking these medications   ibuprofen 800 MG tablet Commonly known as: ADVIL     TAKE these medications   conjugated estrogens vaginal cream Commonly known as: PREMARIN Place 0.5 Applicatorfuls vaginally once a week.   gabapentin 300 MG capsule Commonly known as: NEURONTIN Take 600 mg by mouth at bedtime.   methocarbamol 500 MG tablet Commonly known as: ROBAXIN Take 1 tablet (500 mg total) by mouth every 6 (six) hours as needed for muscle spasms. What changed: Another medication with the same name was added. Make sure you understand how and when to take each.   methocarbamol 500 MG tablet Commonly known as: ROBAXIN Take 1 tablet (500  mg total) by mouth every 6 (six) hours as needed for muscle spasms. What changed: You were already taking a medication with the same name, and this prescription was added. Make sure you understand how and when to take each.   Oxycodone HCl 10 MG Tabs Take 1 tablet (10 mg total) by mouth every 4 (four) hours as needed (pain.).        Signed: 01/01/2020, MD 12/30/2019, 7:52 AM

## 2019-12-30 NOTE — Evaluation (Signed)
Physical Therapy Evaluation and Discharge Patient Details Name: Kendra Mann MRN: [id number] DOB: [birth year] Today's Date: 12/30/2019   History of Present Illness  Pt is a 54 y/o female who presents to PT s/p L4-S1 PLIF on 12/29/2019.   Clinical Impression  Patient evaluated by Physical Therapy with no further acute PT needs identified. All education has been completed and the patient has no further questions. Pt was able to demonstrate transfers and ambulation with gross modified independence. Pt reports she has caregivers lined up to assist as needed upon return home, and states she feels comfortable with all precautions as she has been throughout this before. Pt was educated on precautions, brace wearing schedule, appropriate activity progression, and car transfer. See below for any follow-up Physical Therapy or equipment needs. PT is signing off. Thank you for this referral.     Follow Up Recommendations No PT follow up;Supervision - Intermittent    Equipment Recommendations  None recommended by PT    Recommendations for Other Services       Precautions / Restrictions Precautions Precautions: Fall;Back Precaution Booklet Issued: Yes (comment) Precaution Comments: Reviewed precautions. Pt was cued for maintenance of precautions during functional mobility.  Required Braces or Orthoses: Spinal Brace Spinal Brace: Lumbar corset(Ordered but not present during session - pt left at home) Restrictions Weight Bearing Restrictions: No      Mobility  Bed Mobility Overal bed mobility: Modified Independent Bed Mobility: Rolling;Sidelying to Sit           General bed mobility comments: No assist required. Good log roll technique with HOB flat and rails lowered to simulate home environment.   Transfers Overall transfer level: Modified independent Equipment used: None             General transfer comment: Pt demonstrated proper hand placement on seated surface for safety. No  assist required.   Ambulation/Gait Ambulation/Gait assistance: Modified independent (Device/Increase time) Gait Distance (Feet): 250 Feet Assistive device: None Gait Pattern/deviations: Step-through pattern;Decreased stride length;Wide base of support Gait velocity: Decreased Gait velocity interpretation: <1.8 ft/sec, indicate of risk for recurrent falls General Gait Details: Overall ambulating well with no overt LOB noted. Pt with wide BOS and increased lateral sway in hips with each step.   Stairs            Wheelchair Mobility    Modified Rankin (Stroke Patients Only)       Balance Overall balance assessment: Mild deficits observed, not formally tested                                           Pertinent Vitals/Pain Pain Assessment: Faces Faces Pain Scale: Hurts a little bit Pain Location: Incision site Pain Descriptors / Indicators: Operative site guarding;Sore Pain Intervention(s): Limited activity within patient's tolerance;Monitored during session;Repositioned    Home Living Family/patient expects to be discharged to:: Private residence Living Arrangements: Alone Available Help at Discharge: Friend(s);Available PRN/intermittently Type of Home: House Home Access: Stairs to enter   Entrance Stairs-Number of Steps: 2-3 small stairs Home Layout: One level Home Equipment: Walker - 2 wheels;Bedside commode      Prior Function Level of Independence: Independent         Comments: Reports she is currently working from home and limiting how much she is going out, so not extremely active PTA.      Hand Dominance   Dominant Hand:  Right    Extremity/Trunk Assessment   Upper Extremity Assessment Upper Extremity Assessment: Overall WFL for tasks assessed    Lower Extremity Assessment Lower Extremity Assessment: Generalized weakness(Consistent with pre-op diagnosis and acute pain )    Cervical / Trunk Assessment Cervical / Trunk  Assessment: Other exceptions Cervical / Trunk Exceptions: s/p surgery  Communication   Communication: No difficulties  Cognition Arousal/Alertness: Awake/alert Behavior During Therapy: WFL for tasks assessed/performed Overall Cognitive Status: Within Functional Limits for tasks assessed                                        General Comments      Exercises     Assessment/Plan    PT Assessment Patent does not need any further PT services  PT Problem List         PT Treatment Interventions      PT Goals (Current goals can be found in the Care Plan section)  Acute Rehab PT Goals Patient Stated Goal: Home this morning PT Goal Formulation: All assessment and education complete, DC therapy    Frequency     Barriers to discharge        Co-evaluation               AM-PAC PT "6 Clicks" Mobility  Outcome Measure Help needed turning from your back to your side while in a flat bed without using bedrails?: None Help needed moving from lying on your back to sitting on the side of a flat bed without using bedrails?: None Help needed moving to and from a bed to a chair (including a wheelchair)?: None Help needed standing up from a chair using your arms (e.g., wheelchair or bedside chair)?: None Help needed to walk in hospital room?: None Help needed climbing 3-5 steps with a railing? : None 6 Click Score: 24    End of Session   Activity Tolerance: Patient tolerated treatment well Patient left: Other (comment)(Standing in room dressing herself to prepare for d/c) Nurse Communication: Mobility status PT Visit Diagnosis: Unsteadiness on feet (R26.81);Pain Pain - part of body: (back)    Time: 8295-6213 PT Time Calculation (min) (ACUTE ONLY): 18 min   Charges:   PT Evaluation $PT Eval Low Complexity: 1 Low          Conni Slipper, PT, DPT Acute Rehabilitation Services Pager: [phone] Office: [phone]   Marylynn Pearson 12/30/2019, 11:10  AM

## 2019-12-30 NOTE — Progress Notes (Signed)
Subjective: Patient reports doing well  Objective: Vital signs in last 24 hours: Temp:  [97 F (36.1 C)-98.5 F (36.9 C)] 97.7 F (36.5 C) (01/20 0437) Pulse Rate:  [63-79] 79 (01/20 0437) Resp:  [10-18] 18 (01/19 2332) BP: (85-103)/(35-72) 100/72 (01/20 0437) SpO2:  [96 %-100 %] 100 % (01/20 0437)  Intake/Output from previous day: 01/19 0701 - 01/20 0700 In: 1895.2 [I.V.:1695.2; IV Piggyback:200] Out: 460 [Urine:260; Blood:200] Intake/Output this shift: No intake/output data recorded.  Physical Exam: Full strength.  Dressing CDI  Lab Results: No results for input(s): WBC, HGB, HCT, PLT in the last 72 hours. BMET No results for input(s): NA, K, CL, CO2, GLUCOSE, BUN, CREATININE, CALCIUM in the last 72 hours.  Studies/Results: DG Lumbar Spine 2-3 Views  Result Date: 12/29/2019 CLINICAL DATA:  Progressive spinal stenosis. EXAM: DG C-ARM 1-60 MIN; LUMBAR SPINE - 2-3 VIEW FLUOROSCOPY TIME:  Fluoroscopy Time:  1 minutes 9 seconds COMPARISON:  MRI dated 12/25/2019 FINDINGS: AP and lateral C-arm images demonstrate the patient undergoing posterior fusion at L4-5 and L5-S1. Hardware appears in good position. Previous anterior fusion at L4-5. IMPRESSION: Posterior fusion performed at L4-5 and L5-S1. FLUOROSCOPY TIME:  1 minutes 9 seconds C-arm fluoroscopic images were obtained intraoperatively and submitted for post operative interpretation. Electronically Signed   By: Francene Boyers M.D.   On: 12/29/2019 10:27   DG C-Arm 1-60 Min  Result Date: 12/29/2019 CLINICAL DATA:  Progressive spinal stenosis. EXAM: DG C-ARM 1-60 MIN; LUMBAR SPINE - 2-3 VIEW FLUOROSCOPY TIME:  Fluoroscopy Time:  1 minutes 9 seconds COMPARISON:  MRI dated 12/25/2019 FINDINGS: AP and lateral C-arm images demonstrate the patient undergoing posterior fusion at L4-5 and L5-S1. Hardware appears in good position. Previous anterior fusion at L4-5. IMPRESSION: Posterior fusion performed at L4-5 and L5-S1. FLUOROSCOPY TIME:  1  minutes 9 seconds C-arm fluoroscopic images were obtained intraoperatively and submitted for post operative interpretation. Electronically Signed   By: Francene Boyers M.D.   On: 12/29/2019 10:27    Assessment/Plan: Doing well.  Discharge home.    LOS: 1 day    [name], MD 12/30/2019, 7:52 AM

## 2019-12-30 NOTE — Progress Notes (Signed)
OT Cancellation Note  Patient Details Name: Kendra Mann MRN: [id number] DOB: 09-10-1966   Cancelled Treatment:    Reason Eval/Treat Not Completed: OT screened, no needs identified, will sign off Spoke with PT Vernona Rieger and patient. Pt dressed on arrival. Discussed any toilet needs or animal care.Pt has CNA staying with her to help give assistance and reports no needs.   Timmothy Euler, OTR/L  Acute Rehabilitation Services Pager: [phone] Office: [phone] .   Mateo Flow 12/30/2019, 9:53 AM

## 2019-12-31 MED FILL — Heparin Sodium (Porcine) Inj 1000 Unit/ML: INTRAMUSCULAR | Qty: 30 | Status: AC

## 2019-12-31 MED FILL — Sodium Chloride IV Soln 0.9%: INTRAVENOUS | Qty: 1000 | Status: AC

## 2020-11-30 IMAGING — RF DG LUMBAR SPINE 2-3V
1 series · 4 of 4 positions shown · non-contrast
Comparison: MRI dated 12/25/2019

CLINICAL DATA: Progressive spinal stenosis.

EXAM:
DG C-ARM 1-60 MIN; LUMBAR SPINE - 2-3 VIEW
FLUOROSCOPY TIME:  Fluoroscopy Time:  1 minutes 9 seconds

[Series 1: run · 4 of 4 slices shown]
[im 1/4]
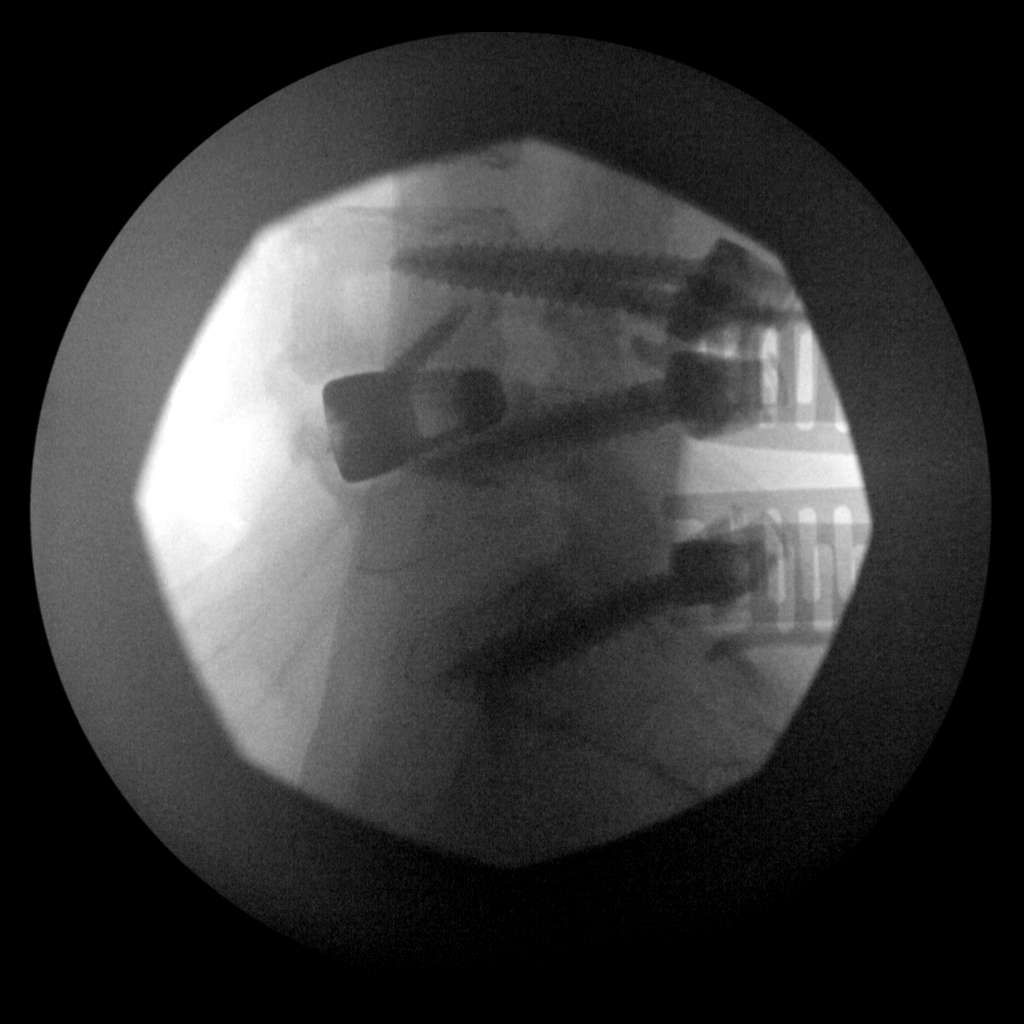
[im 2/4]
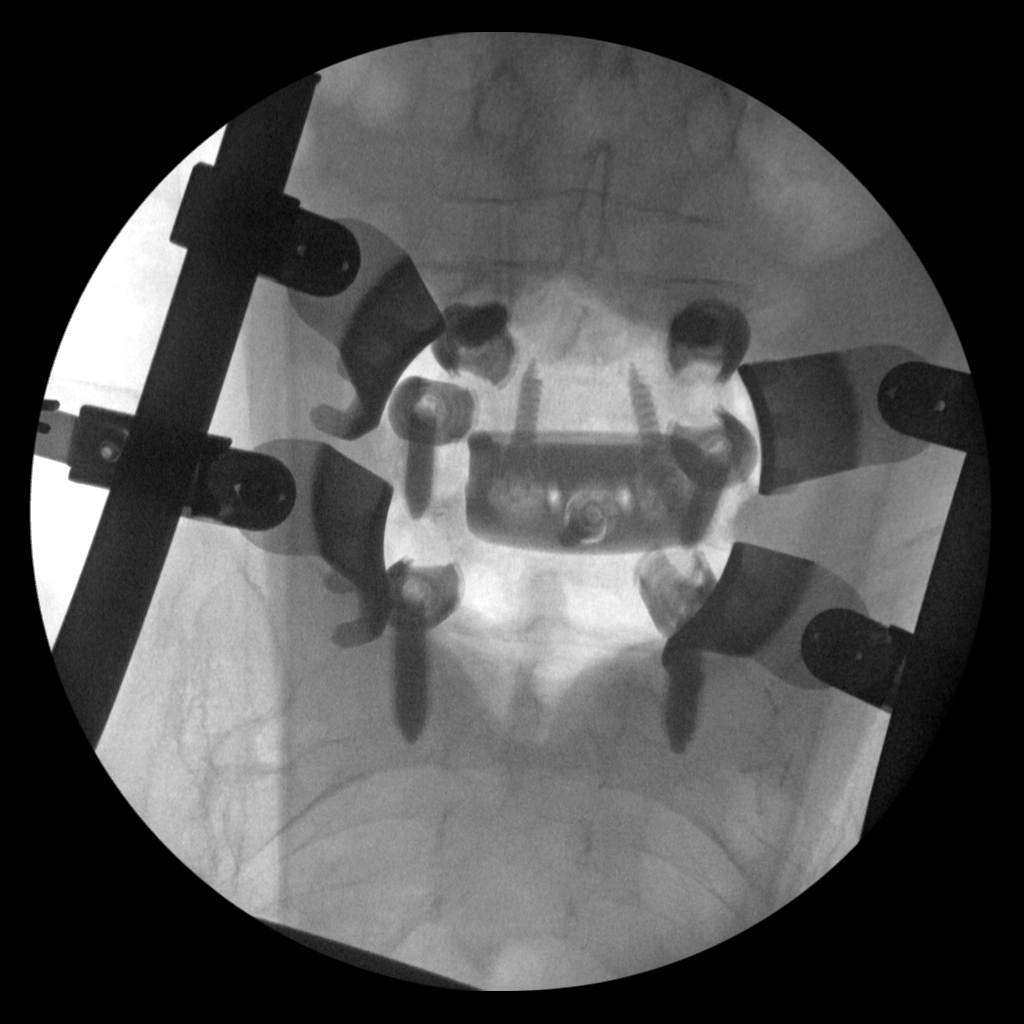
[im 3/4]
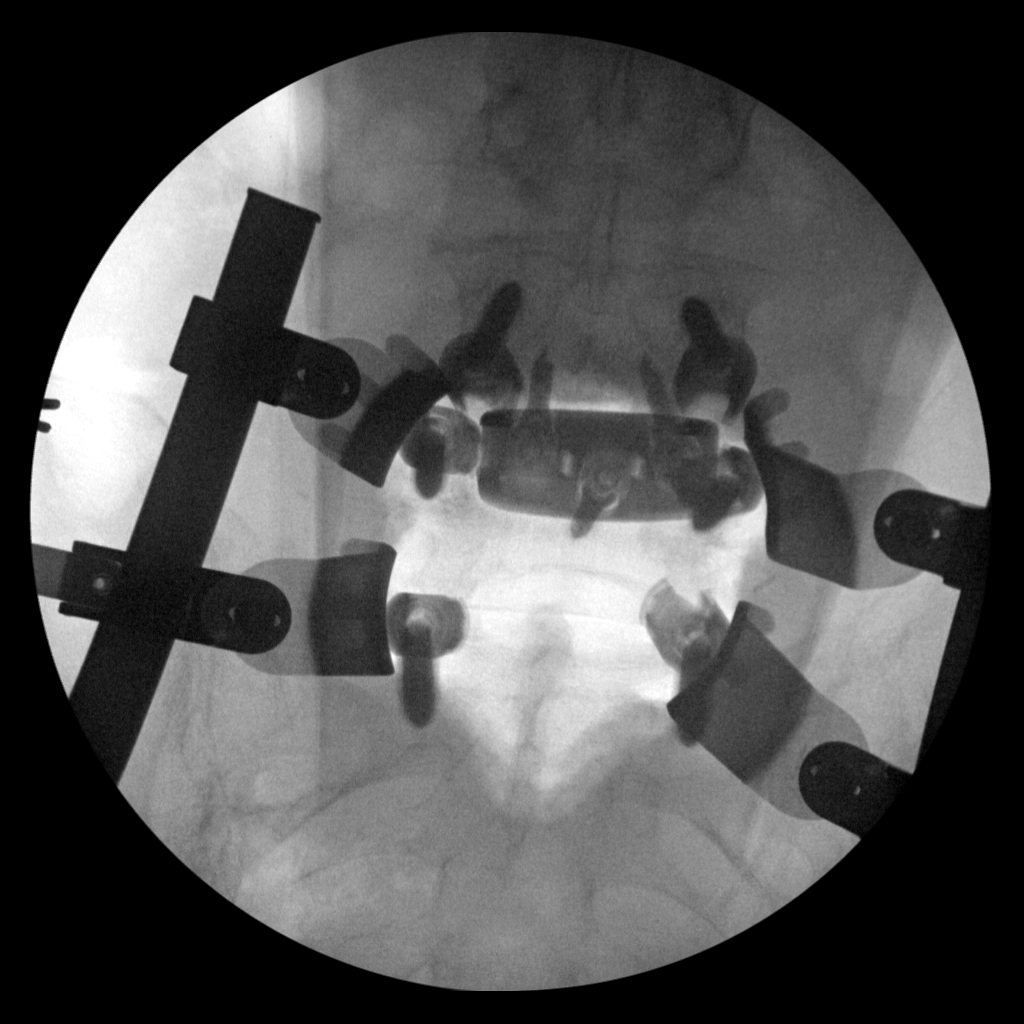
[im 4/4]
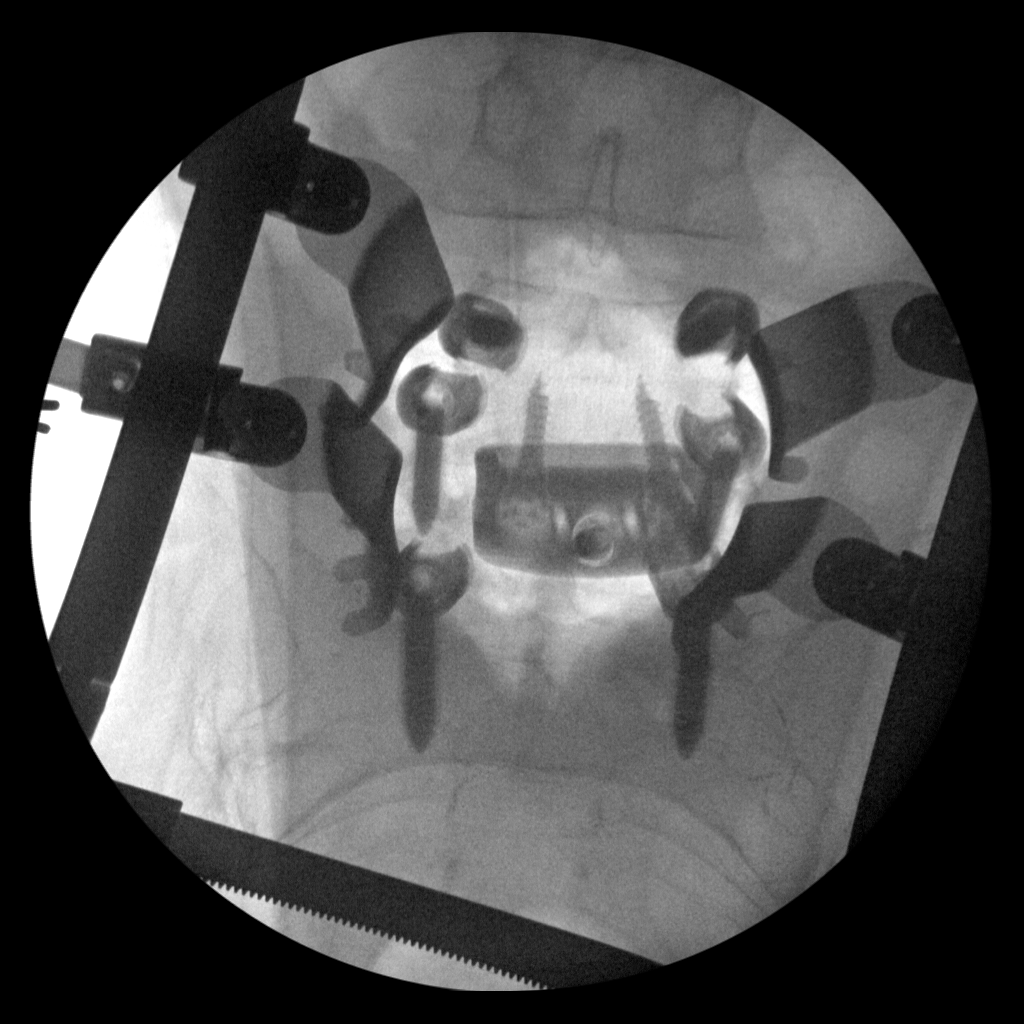

[4 of 4 positions shown; findings below may reference images not displayed]

FINDINGS: AP and lateral C-arm images demonstrate the patient undergoing
posterior fusion at L4-5 and L5-S1. Hardware appears in good
position. Previous anterior fusion at L4-5.
IMPRESSION: Posterior fusion performed at L4-5 and L5-S1.

FLUOROSCOPY TIME:  1 minutes 9 seconds

C-arm fluoroscopic images were obtained intraoperatively and
submitted for post operative interpretation.

## 2022-05-24 DIAGNOSIS — R634 Abnormal weight loss: Secondary | ICD-10-CM | POA: Diagnosis not present

## 2022-05-24 DIAGNOSIS — Z01812 Encounter for preprocedural laboratory examination: Secondary | ICD-10-CM | POA: Diagnosis not present

## 2022-05-24 DIAGNOSIS — F419 Anxiety disorder, unspecified: Secondary | ICD-10-CM | POA: Diagnosis not present

## 2022-05-24 DIAGNOSIS — M48061 Spinal stenosis, lumbar region without neurogenic claudication: Secondary | ICD-10-CM | POA: Diagnosis not present

## 2022-05-25 DIAGNOSIS — R635 Abnormal weight gain: Secondary | ICD-10-CM | POA: Diagnosis not present

## 2022-05-25 DIAGNOSIS — Z760 Encounter for issue of repeat prescription: Secondary | ICD-10-CM | POA: Diagnosis not present

## 2022-05-25 DIAGNOSIS — Z79899 Other long term (current) drug therapy: Secondary | ICD-10-CM | POA: Diagnosis not present

## 2022-05-25 DIAGNOSIS — R0602 Shortness of breath: Secondary | ICD-10-CM | POA: Diagnosis not present

## 2022-05-30 DIAGNOSIS — Z981 Arthrodesis status: Secondary | ICD-10-CM | POA: Diagnosis not present

## 2022-05-30 DIAGNOSIS — M48061 Spinal stenosis, lumbar region without neurogenic claudication: Secondary | ICD-10-CM | POA: Diagnosis not present

## 2022-05-30 DIAGNOSIS — G8929 Other chronic pain: Secondary | ICD-10-CM | POA: Diagnosis not present

## 2022-05-30 DIAGNOSIS — M5116 Intervertebral disc disorders with radiculopathy, lumbar region: Secondary | ICD-10-CM | POA: Diagnosis not present

## 2022-05-30 DIAGNOSIS — M4726 Other spondylosis with radiculopathy, lumbar region: Secondary | ICD-10-CM | POA: Diagnosis not present

## 2022-05-30 DIAGNOSIS — Z9889 Other specified postprocedural states: Secondary | ICD-10-CM | POA: Diagnosis not present

## 2022-05-30 DIAGNOSIS — T402X5A Adverse effect of other opioids, initial encounter: Secondary | ICD-10-CM | POA: Diagnosis not present

## 2022-05-30 DIAGNOSIS — K5903 Drug induced constipation: Secondary | ICD-10-CM | POA: Diagnosis not present

## 2022-05-30 DIAGNOSIS — M532X6 Spinal instabilities, lumbar region: Secondary | ICD-10-CM | POA: Diagnosis not present

## 2022-05-30 DIAGNOSIS — Z472 Encounter for removal of internal fixation device: Secondary | ICD-10-CM | POA: Diagnosis not present

## 2022-05-30 DIAGNOSIS — M532X7 Spinal instabilities, lumbosacral region: Secondary | ICD-10-CM | POA: Diagnosis not present

## 2022-06-29 DIAGNOSIS — R5383 Other fatigue: Secondary | ICD-10-CM | POA: Diagnosis not present

## 2022-06-29 DIAGNOSIS — E559 Vitamin D deficiency, unspecified: Secondary | ICD-10-CM | POA: Diagnosis not present

## 2022-06-29 DIAGNOSIS — E538 Deficiency of other specified B group vitamins: Secondary | ICD-10-CM | POA: Diagnosis not present

## 2022-06-29 DIAGNOSIS — E7800 Pure hypercholesterolemia, unspecified: Secondary | ICD-10-CM | POA: Diagnosis not present

## 2022-06-29 DIAGNOSIS — R635 Abnormal weight gain: Secondary | ICD-10-CM | POA: Diagnosis not present

## 2022-06-29 DIAGNOSIS — Z79899 Other long term (current) drug therapy: Secondary | ICD-10-CM | POA: Diagnosis not present

## 2022-07-12 DIAGNOSIS — Z981 Arthrodesis status: Secondary | ICD-10-CM | POA: Diagnosis not present

## 2022-07-12 DIAGNOSIS — Z9889 Other specified postprocedural states: Secondary | ICD-10-CM | POA: Diagnosis not present

## 2022-07-12 DIAGNOSIS — M5116 Intervertebral disc disorders with radiculopathy, lumbar region: Secondary | ICD-10-CM | POA: Diagnosis not present

## 2022-07-16 DIAGNOSIS — R635 Abnormal weight gain: Secondary | ICD-10-CM | POA: Diagnosis not present

## 2022-07-16 DIAGNOSIS — Z760 Encounter for issue of repeat prescription: Secondary | ICD-10-CM | POA: Diagnosis not present

## 2022-07-16 DIAGNOSIS — Z79899 Other long term (current) drug therapy: Secondary | ICD-10-CM | POA: Diagnosis not present

## 2022-08-14 DIAGNOSIS — E7800 Pure hypercholesterolemia, unspecified: Secondary | ICD-10-CM | POA: Diagnosis not present

## 2022-08-14 DIAGNOSIS — R635 Abnormal weight gain: Secondary | ICD-10-CM | POA: Diagnosis not present

## 2022-08-14 DIAGNOSIS — F17210 Nicotine dependence, cigarettes, uncomplicated: Secondary | ICD-10-CM | POA: Diagnosis not present

## 2022-08-14 DIAGNOSIS — Z79899 Other long term (current) drug therapy: Secondary | ICD-10-CM | POA: Diagnosis not present

## 2022-08-14 DIAGNOSIS — Z760 Encounter for issue of repeat prescription: Secondary | ICD-10-CM | POA: Diagnosis not present

## 2022-08-14 DIAGNOSIS — F17200 Nicotine dependence, unspecified, uncomplicated: Secondary | ICD-10-CM | POA: Diagnosis not present

## 2022-08-26 DIAGNOSIS — M545 Low back pain, unspecified: Secondary | ICD-10-CM | POA: Diagnosis not present

## 2022-08-30 DIAGNOSIS — M5416 Radiculopathy, lumbar region: Secondary | ICD-10-CM | POA: Diagnosis not present

## 2022-08-30 DIAGNOSIS — M5136 Other intervertebral disc degeneration, lumbar region: Secondary | ICD-10-CM | POA: Diagnosis not present

## 2022-08-30 DIAGNOSIS — Z981 Arthrodesis status: Secondary | ICD-10-CM | POA: Diagnosis not present

## 2022-08-30 DIAGNOSIS — M48061 Spinal stenosis, lumbar region without neurogenic claudication: Secondary | ICD-10-CM | POA: Diagnosis not present

## 2022-08-30 DIAGNOSIS — M545 Low back pain, unspecified: Secondary | ICD-10-CM | POA: Diagnosis not present

## 2022-08-30 DIAGNOSIS — M5116 Intervertebral disc disorders with radiculopathy, lumbar region: Secondary | ICD-10-CM | POA: Diagnosis not present

## 2022-08-30 DIAGNOSIS — Z9889 Other specified postprocedural states: Secondary | ICD-10-CM | POA: Diagnosis not present

## 2022-09-04 DIAGNOSIS — Z981 Arthrodesis status: Secondary | ICD-10-CM | POA: Diagnosis not present

## 2022-09-04 DIAGNOSIS — M48061 Spinal stenosis, lumbar region without neurogenic claudication: Secondary | ICD-10-CM | POA: Diagnosis not present

## 2022-09-13 DIAGNOSIS — R635 Abnormal weight gain: Secondary | ICD-10-CM | POA: Diagnosis not present

## 2022-09-13 DIAGNOSIS — Z79899 Other long term (current) drug therapy: Secondary | ICD-10-CM | POA: Diagnosis not present

## 2022-09-13 DIAGNOSIS — Z760 Encounter for issue of repeat prescription: Secondary | ICD-10-CM | POA: Diagnosis not present

## 2022-10-12 DIAGNOSIS — R635 Abnormal weight gain: Secondary | ICD-10-CM | POA: Diagnosis not present

## 2022-10-12 DIAGNOSIS — Z760 Encounter for issue of repeat prescription: Secondary | ICD-10-CM | POA: Diagnosis not present

## 2022-10-12 DIAGNOSIS — Z79899 Other long term (current) drug therapy: Secondary | ICD-10-CM | POA: Diagnosis not present

## 2022-10-18 DIAGNOSIS — M545 Low back pain, unspecified: Secondary | ICD-10-CM | POA: Diagnosis not present

## 2022-10-18 DIAGNOSIS — Z981 Arthrodesis status: Secondary | ICD-10-CM | POA: Diagnosis not present

## 2022-10-18 DIAGNOSIS — M47816 Spondylosis without myelopathy or radiculopathy, lumbar region: Secondary | ICD-10-CM | POA: Diagnosis not present

## 2022-11-09 DIAGNOSIS — Z79899 Other long term (current) drug therapy: Secondary | ICD-10-CM | POA: Diagnosis not present

## 2022-11-09 DIAGNOSIS — R635 Abnormal weight gain: Secondary | ICD-10-CM | POA: Diagnosis not present

## 2022-11-09 DIAGNOSIS — M545 Low back pain, unspecified: Secondary | ICD-10-CM | POA: Diagnosis not present

## 2023-01-01 DIAGNOSIS — R300 Dysuria: Secondary | ICD-10-CM | POA: Diagnosis not present

## 2023-01-01 DIAGNOSIS — E7800 Pure hypercholesterolemia, unspecified: Secondary | ICD-10-CM | POA: Diagnosis not present

## 2023-01-01 DIAGNOSIS — Z760 Encounter for issue of repeat prescription: Secondary | ICD-10-CM | POA: Diagnosis not present

## 2023-01-01 DIAGNOSIS — Z79899 Other long term (current) drug therapy: Secondary | ICD-10-CM | POA: Diagnosis not present

## 2023-01-01 DIAGNOSIS — R635 Abnormal weight gain: Secondary | ICD-10-CM | POA: Diagnosis not present

## 2023-01-24 DIAGNOSIS — Z4789 Encounter for other orthopedic aftercare: Secondary | ICD-10-CM | POA: Diagnosis not present

## 2023-01-24 DIAGNOSIS — M5136 Other intervertebral disc degeneration, lumbar region: Secondary | ICD-10-CM | POA: Diagnosis not present

## 2023-01-24 DIAGNOSIS — M48061 Spinal stenosis, lumbar region without neurogenic claudication: Secondary | ICD-10-CM | POA: Diagnosis not present

## 2023-01-24 DIAGNOSIS — M47816 Spondylosis without myelopathy or radiculopathy, lumbar region: Secondary | ICD-10-CM | POA: Diagnosis not present

## 2023-01-24 DIAGNOSIS — Z981 Arthrodesis status: Secondary | ICD-10-CM | POA: Diagnosis not present

## 2023-02-01 DIAGNOSIS — R635 Abnormal weight gain: Secondary | ICD-10-CM | POA: Diagnosis not present

## 2023-02-01 DIAGNOSIS — Z79899 Other long term (current) drug therapy: Secondary | ICD-10-CM | POA: Diagnosis not present

## 2023-02-01 DIAGNOSIS — Z760 Encounter for issue of repeat prescription: Secondary | ICD-10-CM | POA: Diagnosis not present

## 2023-02-05 DIAGNOSIS — Z79899 Other long term (current) drug therapy: Secondary | ICD-10-CM | POA: Diagnosis not present

## 2023-02-26 DIAGNOSIS — Z780 Asymptomatic menopausal state: Secondary | ICD-10-CM | POA: Diagnosis not present

## 2023-02-26 DIAGNOSIS — R635 Abnormal weight gain: Secondary | ICD-10-CM | POA: Diagnosis not present

## 2023-02-26 DIAGNOSIS — Z79899 Other long term (current) drug therapy: Secondary | ICD-10-CM | POA: Diagnosis not present

## 2023-02-28 DIAGNOSIS — Z79899 Other long term (current) drug therapy: Secondary | ICD-10-CM | POA: Diagnosis not present

## 2023-03-29 DIAGNOSIS — M810 Age-related osteoporosis without current pathological fracture: Secondary | ICD-10-CM | POA: Diagnosis not present

## 2023-03-29 DIAGNOSIS — Z6827 Body mass index (BMI) 27.0-27.9, adult: Secondary | ICD-10-CM | POA: Diagnosis not present

## 2023-03-29 DIAGNOSIS — R635 Abnormal weight gain: Secondary | ICD-10-CM | POA: Diagnosis not present

## 2023-03-29 DIAGNOSIS — Z79899 Other long term (current) drug therapy: Secondary | ICD-10-CM | POA: Diagnosis not present

## 2023-04-02 DIAGNOSIS — Z79899 Other long term (current) drug therapy: Secondary | ICD-10-CM | POA: Diagnosis not present

## 2023-05-08 DIAGNOSIS — Z79899 Other long term (current) drug therapy: Secondary | ICD-10-CM | POA: Diagnosis not present

## 2023-05-08 DIAGNOSIS — R635 Abnormal weight gain: Secondary | ICD-10-CM | POA: Diagnosis not present

## 2023-05-08 DIAGNOSIS — R0602 Shortness of breath: Secondary | ICD-10-CM | POA: Diagnosis not present

## 2023-05-08 DIAGNOSIS — Z6826 Body mass index (BMI) 26.0-26.9, adult: Secondary | ICD-10-CM | POA: Diagnosis not present

## 2023-05-29 DIAGNOSIS — M5136 Other intervertebral disc degeneration, lumbar region: Secondary | ICD-10-CM | POA: Diagnosis not present

## 2023-05-29 DIAGNOSIS — Z9889 Other specified postprocedural states: Secondary | ICD-10-CM | POA: Diagnosis not present

## 2023-05-29 DIAGNOSIS — Z981 Arthrodesis status: Secondary | ICD-10-CM | POA: Diagnosis not present

## 2023-05-29 DIAGNOSIS — M5416 Radiculopathy, lumbar region: Secondary | ICD-10-CM | POA: Diagnosis not present

## 2023-06-06 DIAGNOSIS — F419 Anxiety disorder, unspecified: Secondary | ICD-10-CM | POA: Diagnosis not present

## 2023-06-06 DIAGNOSIS — Z6826 Body mass index (BMI) 26.0-26.9, adult: Secondary | ICD-10-CM | POA: Diagnosis not present

## 2023-06-06 DIAGNOSIS — M545 Low back pain, unspecified: Secondary | ICD-10-CM | POA: Diagnosis not present

## 2023-06-06 DIAGNOSIS — R0602 Shortness of breath: Secondary | ICD-10-CM | POA: Diagnosis not present

## 2023-06-06 DIAGNOSIS — R635 Abnormal weight gain: Secondary | ICD-10-CM | POA: Diagnosis not present

## 2023-06-06 DIAGNOSIS — Z79899 Other long term (current) drug therapy: Secondary | ICD-10-CM | POA: Diagnosis not present

## 2023-06-10 DIAGNOSIS — M5116 Intervertebral disc disorders with radiculopathy, lumbar region: Secondary | ICD-10-CM | POA: Diagnosis not present

## 2023-06-10 DIAGNOSIS — M48061 Spinal stenosis, lumbar region without neurogenic claudication: Secondary | ICD-10-CM | POA: Diagnosis not present

## 2023-06-10 DIAGNOSIS — M4726 Other spondylosis with radiculopathy, lumbar region: Secondary | ICD-10-CM | POA: Diagnosis not present

## 2023-06-10 DIAGNOSIS — M4327 Fusion of spine, lumbosacral region: Secondary | ICD-10-CM | POA: Diagnosis not present

## 2023-06-10 DIAGNOSIS — M5416 Radiculopathy, lumbar region: Secondary | ICD-10-CM | POA: Diagnosis not present

## 2023-06-10 DIAGNOSIS — M4856XA Collapsed vertebra, not elsewhere classified, lumbar region, initial encounter for fracture: Secondary | ICD-10-CM | POA: Diagnosis not present

## 2023-06-10 DIAGNOSIS — M5136 Other intervertebral disc degeneration, lumbar region: Secondary | ICD-10-CM | POA: Diagnosis not present

## 2023-06-25 DIAGNOSIS — M5416 Radiculopathy, lumbar region: Secondary | ICD-10-CM | POA: Diagnosis not present

## 2023-06-29 DIAGNOSIS — Z6826 Body mass index (BMI) 26.0-26.9, adult: Secondary | ICD-10-CM | POA: Diagnosis not present

## 2023-06-29 DIAGNOSIS — Z79899 Other long term (current) drug therapy: Secondary | ICD-10-CM | POA: Diagnosis not present

## 2023-06-29 DIAGNOSIS — F419 Anxiety disorder, unspecified: Secondary | ICD-10-CM | POA: Diagnosis not present

## 2023-06-29 DIAGNOSIS — R635 Abnormal weight gain: Secondary | ICD-10-CM | POA: Diagnosis not present

## 2023-07-30 DIAGNOSIS — R635 Abnormal weight gain: Secondary | ICD-10-CM | POA: Diagnosis not present

## 2023-07-30 DIAGNOSIS — Z6826 Body mass index (BMI) 26.0-26.9, adult: Secondary | ICD-10-CM | POA: Diagnosis not present

## 2023-07-30 DIAGNOSIS — Z760 Encounter for issue of repeat prescription: Secondary | ICD-10-CM | POA: Diagnosis not present

## 2023-07-30 DIAGNOSIS — Z79899 Other long term (current) drug therapy: Secondary | ICD-10-CM | POA: Diagnosis not present

## 2023-08-01 DIAGNOSIS — M79605 Pain in left leg: Secondary | ICD-10-CM | POA: Diagnosis not present

## 2023-08-01 DIAGNOSIS — M5136 Other intervertebral disc degeneration, lumbar region: Secondary | ICD-10-CM | POA: Diagnosis not present

## 2023-08-01 DIAGNOSIS — M5116 Intervertebral disc disorders with radiculopathy, lumbar region: Secondary | ICD-10-CM | POA: Diagnosis not present

## 2023-08-01 DIAGNOSIS — M960 Pseudarthrosis after fusion or arthrodesis: Secondary | ICD-10-CM | POA: Diagnosis not present

## 2023-08-01 DIAGNOSIS — M48061 Spinal stenosis, lumbar region without neurogenic claudication: Secondary | ICD-10-CM | POA: Diagnosis not present

## 2023-08-01 DIAGNOSIS — M79604 Pain in right leg: Secondary | ICD-10-CM | POA: Diagnosis not present

## 2023-08-01 DIAGNOSIS — Z981 Arthrodesis status: Secondary | ICD-10-CM | POA: Diagnosis not present

## 2023-08-01 DIAGNOSIS — M5416 Radiculopathy, lumbar region: Secondary | ICD-10-CM | POA: Diagnosis not present

## 2023-08-01 DIAGNOSIS — X58XXXA Exposure to other specified factors, initial encounter: Secondary | ICD-10-CM | POA: Diagnosis not present

## 2023-08-01 DIAGNOSIS — S32039A Unspecified fracture of third lumbar vertebra, initial encounter for closed fracture: Secondary | ICD-10-CM | POA: Diagnosis not present

## 2023-08-01 DIAGNOSIS — M47816 Spondylosis without myelopathy or radiculopathy, lumbar region: Secondary | ICD-10-CM | POA: Diagnosis not present

## 2023-08-14 DIAGNOSIS — M48061 Spinal stenosis, lumbar region without neurogenic claudication: Secondary | ICD-10-CM | POA: Diagnosis not present

## 2023-08-14 DIAGNOSIS — S32009K Unspecified fracture of unspecified lumbar vertebra, subsequent encounter for fracture with nonunion: Secondary | ICD-10-CM | POA: Diagnosis not present

## 2023-08-14 DIAGNOSIS — Z981 Arthrodesis status: Secondary | ICD-10-CM | POA: Diagnosis not present

## 2023-08-14 DIAGNOSIS — M960 Pseudarthrosis after fusion or arthrodesis: Secondary | ICD-10-CM | POA: Diagnosis not present

## 2023-08-14 DIAGNOSIS — T84226A Displacement of internal fixation device of vertebrae, initial encounter: Secondary | ICD-10-CM | POA: Diagnosis not present

## 2023-08-14 DIAGNOSIS — R29898 Other symptoms and signs involving the musculoskeletal system: Secondary | ICD-10-CM | POA: Diagnosis not present

## 2023-08-14 DIAGNOSIS — M79605 Pain in left leg: Secondary | ICD-10-CM | POA: Diagnosis not present

## 2023-08-14 DIAGNOSIS — Z5189 Encounter for other specified aftercare: Secondary | ICD-10-CM | POA: Diagnosis not present

## 2023-08-14 DIAGNOSIS — Y838 Other surgical procedures as the cause of abnormal reaction of the patient, or of later complication, without mention of misadventure at the time of the procedure: Secondary | ICD-10-CM | POA: Diagnosis not present

## 2023-09-05 DIAGNOSIS — Q780 Osteogenesis imperfecta: Secondary | ICD-10-CM | POA: Insufficient documentation

## 2023-09-05 DIAGNOSIS — E559 Vitamin D deficiency, unspecified: Secondary | ICD-10-CM | POA: Insufficient documentation

## 2023-09-20 DIAGNOSIS — M48061 Spinal stenosis, lumbar region without neurogenic claudication: Secondary | ICD-10-CM | POA: Diagnosis not present

## 2023-09-20 DIAGNOSIS — Z981 Arthrodesis status: Secondary | ICD-10-CM | POA: Diagnosis not present

## 2023-09-27 DIAGNOSIS — Z981 Arthrodesis status: Secondary | ICD-10-CM | POA: Diagnosis not present

## 2023-09-27 DIAGNOSIS — M438X6 Other specified deforming dorsopathies, lumbar region: Secondary | ICD-10-CM | POA: Diagnosis not present

## 2023-09-27 DIAGNOSIS — M48061 Spinal stenosis, lumbar region without neurogenic claudication: Secondary | ICD-10-CM | POA: Diagnosis not present

## 2023-11-04 DIAGNOSIS — Z6829 Body mass index (BMI) 29.0-29.9, adult: Secondary | ICD-10-CM | POA: Insufficient documentation

## 2023-11-04 DIAGNOSIS — E538 Deficiency of other specified B group vitamins: Secondary | ICD-10-CM | POA: Diagnosis not present

## 2023-11-04 DIAGNOSIS — M8580 Other specified disorders of bone density and structure, unspecified site: Secondary | ICD-10-CM | POA: Diagnosis not present

## 2023-11-04 DIAGNOSIS — R7303 Prediabetes: Secondary | ICD-10-CM | POA: Insufficient documentation

## 2023-11-04 DIAGNOSIS — F419 Anxiety disorder, unspecified: Secondary | ICD-10-CM | POA: Diagnosis not present

## 2023-11-04 DIAGNOSIS — M48061 Spinal stenosis, lumbar region without neurogenic claudication: Secondary | ICD-10-CM | POA: Diagnosis not present

## 2023-12-23 DIAGNOSIS — M810 Age-related osteoporosis without current pathological fracture: Secondary | ICD-10-CM | POA: Diagnosis not present

## 2023-12-23 DIAGNOSIS — F419 Anxiety disorder, unspecified: Secondary | ICD-10-CM | POA: Diagnosis not present

## 2023-12-23 DIAGNOSIS — E538 Deficiency of other specified B group vitamins: Secondary | ICD-10-CM | POA: Diagnosis not present

## 2023-12-23 DIAGNOSIS — E875 Hyperkalemia: Secondary | ICD-10-CM | POA: Diagnosis not present

## 2023-12-31 DIAGNOSIS — M47816 Spondylosis without myelopathy or radiculopathy, lumbar region: Secondary | ICD-10-CM | POA: Diagnosis not present

## 2023-12-31 DIAGNOSIS — Z5189 Encounter for other specified aftercare: Secondary | ICD-10-CM | POA: Diagnosis not present

## 2023-12-31 DIAGNOSIS — M5416 Radiculopathy, lumbar region: Secondary | ICD-10-CM | POA: Diagnosis not present

## 2024-01-01 DIAGNOSIS — Z9889 Other specified postprocedural states: Secondary | ICD-10-CM | POA: Insufficient documentation

## 2024-02-17 DIAGNOSIS — G8929 Other chronic pain: Secondary | ICD-10-CM | POA: Diagnosis not present

## 2024-02-17 DIAGNOSIS — E538 Deficiency of other specified B group vitamins: Secondary | ICD-10-CM | POA: Diagnosis not present

## 2024-02-17 DIAGNOSIS — M545 Low back pain, unspecified: Secondary | ICD-10-CM | POA: Diagnosis not present

## 2024-02-17 DIAGNOSIS — E875 Hyperkalemia: Secondary | ICD-10-CM | POA: Diagnosis not present

## 2024-02-17 DIAGNOSIS — F419 Anxiety disorder, unspecified: Secondary | ICD-10-CM | POA: Diagnosis not present

## 2024-02-17 DIAGNOSIS — E559 Vitamin D deficiency, unspecified: Secondary | ICD-10-CM | POA: Diagnosis not present

## 2024-02-28 DIAGNOSIS — G8929 Other chronic pain: Secondary | ICD-10-CM | POA: Diagnosis not present

## 2024-02-28 DIAGNOSIS — M545 Low back pain, unspecified: Secondary | ICD-10-CM | POA: Diagnosis not present

## 2024-03-05 DIAGNOSIS — Z9889 Other specified postprocedural states: Secondary | ICD-10-CM | POA: Diagnosis not present

## 2024-03-05 DIAGNOSIS — M5416 Radiculopathy, lumbar region: Secondary | ICD-10-CM | POA: Diagnosis not present

## 2024-03-15 DIAGNOSIS — Z472 Encounter for removal of internal fixation device: Secondary | ICD-10-CM | POA: Diagnosis not present

## 2024-03-15 DIAGNOSIS — M5126 Other intervertebral disc displacement, lumbar region: Secondary | ICD-10-CM | POA: Diagnosis not present

## 2024-03-15 DIAGNOSIS — Z9889 Other specified postprocedural states: Secondary | ICD-10-CM | POA: Diagnosis not present

## 2024-03-15 DIAGNOSIS — M419 Scoliosis, unspecified: Secondary | ICD-10-CM | POA: Diagnosis not present

## 2024-03-15 DIAGNOSIS — M47816 Spondylosis without myelopathy or radiculopathy, lumbar region: Secondary | ICD-10-CM | POA: Diagnosis not present

## 2024-03-15 DIAGNOSIS — M545 Low back pain, unspecified: Secondary | ICD-10-CM | POA: Diagnosis not present

## 2024-03-24 DIAGNOSIS — Z6831 Body mass index (BMI) 31.0-31.9, adult: Secondary | ICD-10-CM | POA: Diagnosis not present

## 2024-03-24 DIAGNOSIS — D3502 Benign neoplasm of left adrenal gland: Secondary | ICD-10-CM | POA: Diagnosis not present

## 2024-03-24 DIAGNOSIS — N6313 Unspecified lump in the right breast, lower outer quadrant: Secondary | ICD-10-CM | POA: Diagnosis not present

## 2024-03-24 DIAGNOSIS — Z0000 Encounter for general adult medical examination without abnormal findings: Secondary | ICD-10-CM | POA: Diagnosis not present

## 2024-03-31 DIAGNOSIS — Z6831 Body mass index (BMI) 31.0-31.9, adult: Secondary | ICD-10-CM | POA: Diagnosis not present

## 2024-03-31 DIAGNOSIS — M545 Low back pain, unspecified: Secondary | ICD-10-CM | POA: Diagnosis not present

## 2024-03-31 DIAGNOSIS — R102 Pelvic and perineal pain: Secondary | ICD-10-CM | POA: Diagnosis not present

## 2024-04-06 DIAGNOSIS — R92322 Mammographic fibroglandular density, left breast: Secondary | ICD-10-CM | POA: Diagnosis not present

## 2024-04-06 DIAGNOSIS — N6311 Unspecified lump in the right breast, upper outer quadrant: Secondary | ICD-10-CM | POA: Diagnosis not present

## 2024-04-06 DIAGNOSIS — N6341 Unspecified lump in right breast, subareolar: Secondary | ICD-10-CM | POA: Diagnosis not present

## 2024-04-06 DIAGNOSIS — R92323 Mammographic fibroglandular density, bilateral breasts: Secondary | ICD-10-CM | POA: Diagnosis not present

## 2024-04-06 DIAGNOSIS — N6342 Unspecified lump in left breast, subareolar: Secondary | ICD-10-CM | POA: Diagnosis not present

## 2024-07-02 DIAGNOSIS — M5416 Radiculopathy, lumbar region: Secondary | ICD-10-CM | POA: Diagnosis not present

## 2024-08-11 DIAGNOSIS — F419 Anxiety disorder, unspecified: Secondary | ICD-10-CM | POA: Diagnosis not present

## 2024-08-11 DIAGNOSIS — M810 Age-related osteoporosis without current pathological fracture: Secondary | ICD-10-CM | POA: Diagnosis not present

## 2024-08-11 DIAGNOSIS — M545 Low back pain, unspecified: Secondary | ICD-10-CM | POA: Diagnosis not present

## 2024-08-11 DIAGNOSIS — Z6831 Body mass index (BMI) 31.0-31.9, adult: Secondary | ICD-10-CM | POA: Diagnosis not present

## 2024-08-26 DIAGNOSIS — M545 Low back pain, unspecified: Secondary | ICD-10-CM | POA: Diagnosis not present

## 2024-08-26 DIAGNOSIS — G8929 Other chronic pain: Secondary | ICD-10-CM | POA: Diagnosis not present

## 2024-09-23 DIAGNOSIS — G8929 Other chronic pain: Secondary | ICD-10-CM | POA: Diagnosis not present

## 2024-09-23 DIAGNOSIS — M533 Sacrococcygeal disorders, not elsewhere classified: Secondary | ICD-10-CM | POA: Diagnosis not present

## 2024-09-23 DIAGNOSIS — M5136 Other intervertebral disc degeneration, lumbar region with discogenic back pain only: Secondary | ICD-10-CM | POA: Diagnosis not present

## 2024-10-29 DIAGNOSIS — M545 Low back pain, unspecified: Secondary | ICD-10-CM | POA: Diagnosis not present

## 2024-10-29 DIAGNOSIS — M79604 Pain in right leg: Secondary | ICD-10-CM | POA: Diagnosis not present

## 2024-10-29 DIAGNOSIS — M533 Sacrococcygeal disorders, not elsewhere classified: Secondary | ICD-10-CM | POA: Diagnosis not present

## 2024-10-29 DIAGNOSIS — Z87891 Personal history of nicotine dependence: Secondary | ICD-10-CM | POA: Diagnosis not present

## 2024-10-29 DIAGNOSIS — Z981 Arthrodesis status: Secondary | ICD-10-CM | POA: Diagnosis not present

## 2024-10-29 DIAGNOSIS — G8929 Other chronic pain: Secondary | ICD-10-CM | POA: Diagnosis not present

## 2024-10-29 DIAGNOSIS — M79605 Pain in left leg: Secondary | ICD-10-CM | POA: Diagnosis not present

## 2024-12-16 ENCOUNTER — Encounter (HOSPITAL_BASED_OUTPATIENT_CLINIC_OR_DEPARTMENT_OTHER): Payer: Self-pay

## 2024-12-16 ENCOUNTER — Ambulatory Visit (HOSPITAL_BASED_OUTPATIENT_CLINIC_OR_DEPARTMENT_OTHER)
Admission: RE | Admit: 2024-12-16 | Discharge: 2024-12-16 | Disposition: A | Discharge status: Home / Self Care | Payer: Self-pay | Source: Ambulatory Visit

## 2024-12-16 VITALS — BP 81/50 | HR 106 | Temp 98.5°F | Resp 20

## 2024-12-16 DIAGNOSIS — R1010 Upper abdominal pain, unspecified: Secondary | ICD-10-CM

## 2024-12-16 DIAGNOSIS — I959 Hypotension, unspecified: Secondary | ICD-10-CM

## 2024-12-16 NOTE — ED Provider Notes (Signed)
 " [facility]    CSN: [id number] Arrival date & time: 12/16/24  1102      History   Chief Complaint Chief Complaint  Patient presents with   Abdominal Pain    Entered by patient    HPI Kendra Mann is a 59 y.o. female.   Pt is a 59 year old female that presents with upper abdominal pain. Presents for eval of feeling my heartbeat in my stomach. Patient has a long history of abd pain and has never been diagnosed with anything. States feeling as if her heart is beating in her abd has been present since just before Christmas. States when attempting to have a bowel movement and bears down slightly, feels pain to mid abdomen. No urinary symptoms, No rectal bleeding. Left side abd pain with palpation. Patient is very guarded when mid abd and left side of abd palpated. No pain to right side. Blood pressure very low on arrival.    Abdominal Pain   Past Medical History:  Diagnosis Date   Back pain    Lumbar disc disease with radiculopathy    Lumbar spondylosis    Plantar fasciitis     Patient Active Problem List   Diagnosis Date Noted   Spinal stenosis of lumbar region with radiculopathy 12/29/2019   Spondylolysis, lumbar region 12/26/2018   Lumbar radiculopathy 02/17/2018   Lumbar degenerative disc disease 01/20/2018   Lumbar spondylosis 01/20/2018   Plantar fasciitis 08/30/2017    Past Surgical History:  Procedure Laterality Date   ABDOMINAL EXPOSURE N/A 12/26/2018   Procedure: ABDOMINAL EXPOSURE;  Surgeon: Gretta Lonni PARAS, MD;  Location: [facility] OR;  Service: Vascular;  Laterality: N/A;   ABDOMINAL HYSTERECTOMY     ANTERIOR LUMBAR FUSION N/A 12/26/2018   Procedure: Lumbar 4-5 Anterior lumbar interbody fusion with Dr. Lonni Gretta;  Surgeon: Unice Pac, MD;  Location: [facility] OR;  Service: Neurosurgery;  Laterality: N/A;   COLONOSCOPY     PARTIAL HYSTERECTOMY     ovaries in place   TONSILLECTOMY      OB History   No obstetric history on file.       Home Medications    Prior to Admission medications  Medication Sig Start Date End Date Taking? Authorizing Provider  diclofenac Sodium (VOLTAREN) 1 % GEL Apply 1 g topically 4 (four) times daily. 07/01/24  Yes [provider]  conjugated estrogens  (PREMARIN ) vaginal cream Place 0.5 Applicatorfuls vaginally once a week.     [provider]  gabapentin  (NEURONTIN ) 300 MG capsule Take 600 mg by mouth at bedtime.    [provider]  ibuprofen (ADVIL) 800 MG tablet Take 800 mg by mouth every 6 (six) hours as needed.    [provider]  LORazepam (ATIVAN) 1 MG tablet Take 1 mg by mouth daily as needed.    [provider]  methocarbamol  (ROBAXIN ) 500 MG tablet Take 1 tablet (500 mg total) by mouth every 6 (six) hours as needed for muscle spasms. 12/27/18   Cheryle Debby LABOR, MD  methocarbamol  (ROBAXIN ) 500 MG tablet Take 1 tablet (500 mg total) by mouth every 6 (six) hours as needed for muscle spasms. 12/30/19   Unice Pac, MD  Oxycodone  HCl 10 MG TABS Take 1 tablet (10 mg total) by mouth every 4 (four) hours as needed (pain.). 12/30/19   Unice Pac, MD    Family History History reviewed. No pertinent family history.  Social History Social History[1]   Allergies   Patient has no  known allergies.   Review of Systems Review of Systems  Gastrointestinal:  Positive for abdominal pain.     Physical Exam Triage Vital Signs ED Triage Vitals  Encounter Vitals Group     BP 12/16/24 1124 (!) 81/50     Girls Systolic BP Percentile --      Girls Diastolic BP Percentile --      Boys Systolic BP Percentile --      Boys Diastolic BP Percentile --      Pulse Rate 12/16/24 1124 (!) 106     Resp 12/16/24 1124 20     Temp 12/16/24 1124 98.5 F (36.9 C)     Temp Source 12/16/24 1124 Oral     SpO2 --      Weight --      Height --      Head Circumference --      Peak Flow --      Pain Score 12/16/24 1133 7     Pain Loc --      Pain  Education --      Exclude from Growth Chart --    No data found.  Updated Vital Signs BP (!) 81/50 (BP Location: Right Arm) Comment: 80/50 left arm taken manually.  Pulse (!) 106   Temp 98.5 F (36.9 C) (Oral)   Resp 20   Visual Acuity Right Eye Distance:   Left Eye Distance:   Bilateral Distance:    Right Eye Near:   Left Eye Near:    Bilateral Near:     Physical Exam Vitals and nursing note reviewed.  Constitutional:      General: She is not in acute distress.    Appearance: Normal appearance. She is ill-appearing. She is not toxic-appearing or diaphoretic.  Eyes:     Conjunctiva/sclera: Conjunctivae normal.  Cardiovascular:     Rate and Rhythm: Tachycardia present.  Pulmonary:     Effort: Pulmonary effort is normal.     Breath sounds: Normal breath sounds.  Abdominal:     General: Abdomen is flat. Bowel sounds are normal.     Palpations: Abdomen is soft. There is no mass.     Tenderness: There is abdominal tenderness in the left upper quadrant. There is guarding.  Skin:    General: Skin is warm and dry.  Neurological:     Mental Status: She is alert.  Psychiatric:        Mood and Affect: Mood normal.      UC Treatments / Results  Labs (all labs ordered are listed, but only abnormal results are displayed) Labs Reviewed - No data to display  EKG   Radiology No results found.  Procedures Procedures (including critical care time)  Medications Ordered in UC Medications - No data to display  Initial Impression / Assessment and Plan / UC Course  I have reviewed the triage vital signs and the nursing notes.  Pertinent labs & imaging results that were available during my care of the patient were reviewed by me and considered in my medical decision making (see chart for details).     Upper abdominal pain, hypotension and tachycardia.  Patient with worsening upper abdominal pain over the last couple weeks.  She is unable to hold down any food and much  fluid.  She is hypotensive here today around 80/50.  She is having some weakness, dizziness.  She is guarding on exam in the upper abdomen.  No palpated masses.  Recommendations were to call an ambulance  and take her to [facility].  Patient did not want to have a ambulance.  She will have her friend take her to the ER.  Recommend go there for CT scan, blood work and IV hydration. Final Clinical Impressions(s) / UC Diagnoses   Final diagnoses:  Pain of upper abdomen  Hypotension, unspecified hypotension type     Discharge Instructions      Recommendations are to go to the hospital to get a CT scan, blood work and IV hydration.  I am concerned due to your low blood pressure, increased heart rate and your abdominal pain that is worsened.      ED Prescriptions   None    PDMP not reviewed this encounter.     [1]  Social History Tobacco Use   Smoking status: Never   Smokeless tobacco: Never  Vaping Use   Vaping status: Never Used  Substance Use Topics   Alcohol use: Yes    Comment: rarely   Drug use: Never     Adah Wilbert LABOR, FNP 12/21/24 0824  "

## 2024-12-16 NOTE — Discharge Instructions (Signed)
 Recommendations are to go to the hospital to get a CT scan, blood work and IV hydration.  I am concerned due to your low blood pressure, increased heart rate and your abdominal pain that is worsened.

## 2024-12-16 NOTE — ED Triage Notes (Signed)
 Presents for eval of feeling my heartbeat in my stomach. Patient has a long history of abd pain and has never been diagnosed with anything. States feeling as if her heart is beating in her abd has been present since just before Christmas. States when attempting to have a bowel movement and bears down slightly, feels pain to mid abdomen. No urinary symptoms, No rectal bleeding. Left side abd pain with palpation. Patient is very guarded when mid abd and left side of abd palpated. No pain to right side. Blood pressure very low on arrival.

## 2024-12-21 ENCOUNTER — Other Ambulatory Visit: Payer: Self-pay

## 2024-12-21 ENCOUNTER — Encounter (HOSPITAL_BASED_OUTPATIENT_CLINIC_OR_DEPARTMENT_OTHER): Payer: Self-pay | Admitting: Emergency Medicine

## 2024-12-21 ENCOUNTER — Emergency Department (HOSPITAL_BASED_OUTPATIENT_CLINIC_OR_DEPARTMENT_OTHER)

## 2024-12-21 ENCOUNTER — Emergency Department (HOSPITAL_BASED_OUTPATIENT_CLINIC_OR_DEPARTMENT_OTHER)
Admission: EM | Admit: 2024-12-21 | Discharge: 2024-12-21 | Disposition: A | Discharge status: Home / Self Care | Attending: Emergency Medicine | Admitting: Emergency Medicine

## 2024-12-21 DIAGNOSIS — R1012 Left upper quadrant pain: Secondary | ICD-10-CM | POA: Insufficient documentation

## 2024-12-21 DIAGNOSIS — R1013 Epigastric pain: Secondary | ICD-10-CM | POA: Insufficient documentation

## 2024-12-21 DIAGNOSIS — R112 Nausea with vomiting, unspecified: Secondary | ICD-10-CM | POA: Insufficient documentation

## 2024-12-21 DIAGNOSIS — R109 Unspecified abdominal pain: Secondary | ICD-10-CM | POA: Diagnosis present

## 2024-12-21 DIAGNOSIS — E876 Hypokalemia: Secondary | ICD-10-CM | POA: Diagnosis not present

## 2024-12-21 LAB — CBC
HCT: 37 % (ref 36.0–46.0)
Hemoglobin: 12.4 g/dL (ref 12.0–15.0)
MCH: 31.6 pg (ref 26.0–34.0)
MCHC: 33.5 g/dL (ref 30.0–36.0)
MCV: 94.1 fL (ref 80.0–100.0)
Platelets: 198 K/uL (ref 150–400)
RBC: 3.93 MIL/uL (ref 3.87–5.11)
RDW: 13.2 % (ref 11.5–15.5)
WBC: 6.2 K/uL (ref 4.0–10.5)
nRBC: 0 % (ref 0.0–0.2)

## 2024-12-21 LAB — COMPREHENSIVE METABOLIC PANEL WITH GFR
ALT: 8 U/L (ref 0–44)
AST: 15 U/L (ref 15–41)
Albumin: 4.7 g/dL (ref 3.5–5.0)
Alkaline Phosphatase: 44 U/L (ref 38–126)
Anion gap: 14 (ref 5–15)
BUN: 13 mg/dL (ref 6–20)
CO2: 23 mmol/L (ref 22–32)
Calcium: 9.3 mg/dL (ref 8.9–10.3)
Chloride: 100 mmol/L (ref 98–111)
Creatinine, Ser: 0.76 mg/dL (ref 0.44–1.00)
GFR, Estimated: >60 mL/min
Glucose, Bld: 91 mg/dL (ref 70–99)
Potassium: 3.2 mmol/L — ABNORMAL LOW (ref 3.5–5.1)
Sodium: 137 mmol/L (ref 135–145)
Total Bilirubin: 0.5 mg/dL (ref 0.0–1.2)
Total Protein: 6.9 g/dL (ref 6.5–8.1)

## 2024-12-21 LAB — URINALYSIS, ROUTINE W REFLEX MICROSCOPIC
Glucose, UA: NEGATIVE mg/dL
Hgb urine dipstick: NEGATIVE
Ketones, ur: 40 mg/dL — AB
Leukocytes,Ua: NEGATIVE
Nitrite: NEGATIVE
Protein, ur: 100 mg/dL — AB
Specific Gravity, Urine: >=1.03 (ref 1.005–1.030)
pH: 6 (ref 5.0–8.0)

## 2024-12-21 LAB — LIPASE, BLOOD: Lipase: 26 U/L (ref 11–51)

## 2024-12-21 LAB — URINALYSIS, MICROSCOPIC (REFLEX)

## 2024-12-21 MED ORDER — ONDANSETRON 4 MG PO TBDP: 4.0000 mg | ORAL_TABLET | Freq: Three times a day (TID) | ORAL | 0 refills | Status: AC | PRN

## 2024-12-21 MED ORDER — ONDANSETRON HCL 4 MG/2ML IJ SOLN
4.0000 mg | Freq: Once | INTRAMUSCULAR | Status: AC
  Administered 2024-12-21: 4 mg via INTRAVENOUS
  Filled 2024-12-21: qty 2

## 2024-12-21 MED ORDER — SODIUM CHLORIDE 0.9 % IV BOLUS
1000.0000 mL | Freq: Once | INTRAVENOUS | Status: AC
  Administered 2024-12-21: 1000 mL via INTRAVENOUS

## 2024-12-21 MED ORDER — MORPHINE SULFATE (PF) 4 MG/ML IV SOLN
4.0000 mg | Freq: Once | INTRAVENOUS | Status: AC
  Administered 2024-12-21: 4 mg via INTRAVENOUS
  Filled 2024-12-21: qty 1

## 2024-12-21 MED ORDER — IOHEXOL 300 MG/ML  SOLN
100.0000 mL | Freq: Once | INTRAMUSCULAR | Status: AC | PRN
  Administered 2024-12-21: 100 mL via INTRAVENOUS

## 2024-12-21 MED ORDER — DICYCLOMINE HCL 20 MG PO TABS: 20.0000 mg | ORAL_TABLET | Freq: Two times a day (BID) | ORAL | 0 refills | Status: AC

## 2024-12-21 NOTE — ED Triage Notes (Signed)
 Pt c/o abd pain, n/v, poor po intake x 1 week. Denies fever, diarrhea/ known sick contact.   Took ibuprofen appx 1000 today.

## 2024-12-21 NOTE — ED Provider Notes (Signed)
 " [facility] Provider Note   CSN: [id number] Arrival date & time: 12/21/24  1232     Patient presents with: Abdominal Pain   Kendra Mann is a 59 y.o. female.  Presents today for abdominal pain with nausea and vomiting x 1 week.  Patient also reports poor oral intake.  Patient denies fever, diarrhea, known sick contact, chest pain, shortness of breath, or any other complaints at this time.  Per patient's chart patient was seen in urgent care approximately 1 week ago and was urged to go to the ER for further evaluation at that time.  It does not appear patient sought out evaluation at that time.    Abdominal Pain Associated symptoms: nausea and vomiting        Prior to Admission medications  Medication Sig Start Date End Date Taking? Authorizing Provider  alendronate (FOSAMAX) 70 MG tablet Take 70 mg by mouth once a week. 10/18/23  Yes [provider]  dicyclomine  (BENTYL ) 20 MG tablet Take 1 tablet (20 mg total) by mouth 2 (two) times daily. 12/21/24  Yes Grier Vu N, PA-C  ondansetron  (ZOFRAN -ODT) 4 MG disintegrating tablet Take 1 tablet (4 mg total) by mouth every 8 (eight) hours as needed. 12/21/24  Yes Apphia Cropley N, PA-C  conjugated estrogens  (PREMARIN ) vaginal cream Place 0.5 Applicatorfuls vaginally once a week.     [provider]  diclofenac Sodium (VOLTAREN) 1 % GEL Apply 1 g topically 4 (four) times daily. 07/01/24   [provider]  gabapentin  (NEURONTIN ) 300 MG capsule Take 600 mg by mouth at bedtime.    [provider]  HYDROcodone -acetaminophen  (NORCO) 10-325 MG tablet Take 1 tablet by mouth daily as needed.    [provider]  ibuprofen (ADVIL) 800 MG tablet Take 800 mg by mouth every 6 (six) hours as needed.    [provider]  LORazepam (ATIVAN) 1 MG tablet Take 1 mg by mouth daily as needed.    [provider]  methocarbamol  (ROBAXIN ) 500 MG tablet Take  1 tablet (500 mg total) by mouth every 6 (six) hours as needed for muscle spasms. 12/27/18   Cheryle Debby LABOR, MD  methocarbamol  (ROBAXIN ) 500 MG tablet Take 1 tablet (500 mg total) by mouth every 6 (six) hours as needed for muscle spasms. 12/30/19   Unice Pac, MD  Oxycodone  HCl 10 MG TABS Take 1 tablet (10 mg total) by mouth every 4 (four) hours as needed (pain.). 12/30/19   Unice Pac, MD    Allergies: Patient has no known allergies.    Review of Systems  Constitutional:  Positive for appetite change.  Gastrointestinal:  Positive for abdominal pain, nausea and vomiting.    Updated Vital Signs BP 96/69   Pulse 67   Temp (!) 97.5 F (36.4 C)   Resp 14   Ht 5' 6 (1.676 m)   Wt 70.8 kg   SpO2 96%   BMI 25.18 kg/m   Physical Exam Vitals and nursing note reviewed.  Constitutional:      General: She is not in acute distress.    Appearance: She is well-developed. She is not toxic-appearing.  HENT:     Head: Normocephalic and atraumatic.  Eyes:     Conjunctiva/sclera: Conjunctivae normal.  Cardiovascular:     Rate and Rhythm: Normal rate and regular rhythm.     Heart sounds: Normal heart sounds. No murmur heard. Pulmonary:     Effort: Pulmonary effort is  normal. No respiratory distress.     Breath sounds: Normal breath sounds.  Abdominal:     General: There is no distension.     Palpations: Abdomen is soft.     Tenderness: There is abdominal tenderness in the epigastric area and left upper quadrant. There is no guarding. Negative signs include Murphy's sign, Rovsing's sign and McBurney's sign.  Musculoskeletal:        General: No swelling.     Cervical back: Neck supple.  Skin:    General: Skin is warm and dry.     Capillary Refill: Capillary refill takes less than 2 seconds.  Neurological:     Mental Status: She is alert.  Psychiatric:        Mood and Affect: Mood normal.     (all labs ordered are listed, but only abnormal results are displayed) Labs  Reviewed  COMPREHENSIVE METABOLIC PANEL WITH GFR - Abnormal; Notable for the following components:      Result Value   Potassium 3.2 (*)    All other components within normal limits  URINALYSIS, ROUTINE W REFLEX MICROSCOPIC - Abnormal; Notable for the following components:   APPearance HAZY (*)    Bilirubin Urine MODERATE (*)    Ketones, ur 40 (*)    Protein, ur 100 (*)    All other components within normal limits  URINALYSIS, MICROSCOPIC (REFLEX) - Abnormal; Notable for the following components:   Bacteria, UA FEW (*)    All other components within normal limits  LIPASE, BLOOD  CBC    EKG: EKG Interpretation Date/Time:  Monday December 21 2024 12:55:17 EST Ventricular Rate:  81 PR Interval:  171 QRS Duration:  105 QT Interval:  374 QTC Calculation: 435 R Axis:   7  Text Interpretation: Sinus rhythm normal axis, normal intervals Borderline abnrm T, II, III, aVF, V4-V6, no prior ECG Confirmed by Fredia Kaufman [phone]) on 12/21/2024 12:59:32 PM  Radiology: CT ABDOMEN PELVIS W CONTRAST Result Date: 12/21/2024 CLINICAL DATA:  Abdominal pain, nausea and vomiting EXAM: CT ABDOMEN AND PELVIS WITH CONTRAST TECHNIQUE: Multidetector CT imaging of the abdomen and pelvis was performed using the standard protocol following bolus administration of intravenous contrast. RADIATION DOSE REDUCTION: This exam was performed according to the departmental dose-optimization program which includes automated exposure control, adjustment of the mA and/or kV according to patient size and/or use of iterative reconstruction technique. CONTRAST:  OMNIPAQUE  IOHEXOL  300 MG/ML  SOLN COMPARISON:  [id number] FINDINGS: Lower chest: No acute pleural or parenchymal lung disease. Hepatobiliary: Multiple hepatic cysts are again noted. Otherwise the liver and gallbladder are unremarkable. No biliary duct dilation. Pancreas: Unremarkable. No pancreatic ductal dilatation or surrounding inflammatory changes. Spleen: Normal in  size without focal abnormality. Adrenals/Urinary Tract: 2.3 x 1.6 cm left adrenal nodule, previously characterized as adenoma. No specific follow-up recommended. The right adrenals unremarkable. Kidneys enhance normally and symmetrically. Bladder is minimally distended, without gross abnormality. Stomach/Bowel: No bowel obstruction or ileus. Normal retrocecal appendix. Scattered colonic diverticulosis without diverticulitis. No bowel wall thickening or inflammatory change. Vascular/Lymphatic: No significant vascular findings are present. No enlarged abdominal or pelvic lymph nodes. Reproductive: Status post hysterectomy. No adnexal masses. Other: No free fluid or free intraperitoneal gas. No abdominal wall hernia. Musculoskeletal: No acute or destructive bony abnormalities. Postsurgical changes within the lumbar spine spanning L2 through S1. Reconstructed images demonstrate no additional findings. IMPRESSION: 1. No acute intra-abdominal or intrapelvic process. 2. Scattered colonic diverticulosis without diverticulitis. Electronically Signed   By: Ozell Delores HERO.D.  On: 12/21/2024 16:57     Procedures   Medications Ordered in the ED  morphine  (PF) 4 MG/ML injection 4 mg (4 mg Intravenous Given 12/21/24 1540)  ondansetron  (ZOFRAN ) injection 4 mg (4 mg Intravenous Given 12/21/24 1540)  sodium chloride  0.9 % bolus 1,000 mL (0 mLs Intravenous Stopped 12/21/24 1723)  iohexol  (OMNIPAQUE ) 300 MG/ML solution 100 mL (100 mLs Intravenous Contrast Given 12/21/24 1548)                                    Medical Decision Making Amount and/or Complexity of Data Reviewed Labs: ordered.   This patient presents to the ED for concern of abdominal pain with nausea and vomiting, this involves an extensive number of treatment options, and is a complaint that carries with it a high risk of complications and morbidity.  The differential diagnosis includes viral GI illness, appendicitis, choledocholithiasis, acute  cholecystitis, pancreatitis, SBO, diverticulitis, volvulus, gastroparesis   Additional history obtained:  Additional history obtained from EMR External records from outside source obtained and reviewed including Care Everywhere   Lab Tests:  I Ordered, and personally interpreted labs.  The pertinent results include: Mild hypokalemia 3.2, CBC unremarkable, UA with moderate hemoglobin, 40 ketones, 100 protein, elevated specific gravity, few bacteria, 6-10 RBCs, 6-10 squamous epithelials   Imaging Studies ordered:  I ordered imaging studies including CT abdomen pelvis with contrast  I independently visualized and interpreted imaging which showed no acute intra-abdominal or intrapelvic process.  Scattered colonic diverticulosis without diverticulitis.  The I agree with the radiologist interpretation   Cardiac Monitoring: / EKG:  The patient was maintained on a cardiac monitor.  I personally viewed and interpreted the cardiac monitored which showed an underlying rhythm of: Normal sinus rhythm   Problem List / ED Course / Critical interventions / Medication management  I ordered medication including IVF, morphine  and Zofran  I have reviewed the patients home medicines and have made adjustments as needed  Test / Admission - Considered:  Patient tolerating p.o. intake without issue prior to discharge and requesting discharge at this time. Considered for admission or further workup however patient's vital signs, physical exam, labs, and imaging are reassuring.  Patient symptoms likely due to viral GI illness.  Patient given outpatient course of Bentyl  for abdominal pain and Zofran  for nausea and vomiting.  Patient to follow-up with gastroenterology if her symptoms persist for further evaluation and workup.  Patient given return precautions.  I feel patient is safe for discharge at this time.     Final diagnoses:  Abdominal pain, unspecified abdominal location  Nausea and vomiting,  unspecified vomiting type    ED Discharge Orders          Ordered    dicyclomine  (BENTYL ) 20 MG tablet  2 times daily        12/21/24 1719    ondansetron  (ZOFRAN -ODT) 4 MG disintegrating tablet  Every 8 hours PRN        12/21/24 1719               Francis Ileana SAILOR, PA-C 12/21/24 1724    Jerrol Agent, MD 12/21/24 1755  "

## 2024-12-21 NOTE — Discharge Instructions (Addendum)
 Today you were seen for abdominal pain with nausea and vomiting.  Your workup while in the emergency department was reassuring.  Please pick up your Bentyl  for abdominal pain and Zofran  for nausea and vomiting.  Maintain adequate oral hydration.  Thank you for letting us  treat you today. After reviewing your labs and imaging, I feel you are safe to go home. Please follow up with your PCP in the next several days and provide them with your records from this visit. Return to the Emergency Room if pain becomes severe or symptoms worsen.

## 2025-01-01 ENCOUNTER — Ambulatory Visit (INDEPENDENT_AMBULATORY_CARE_PROVIDER_SITE_OTHER): Admitting: Family Medicine

## 2025-01-01 ENCOUNTER — Other Ambulatory Visit (HOSPITAL_BASED_OUTPATIENT_CLINIC_OR_DEPARTMENT_OTHER): Payer: Self-pay

## 2025-01-01 VITALS — BP 93/70 | HR 78 | Temp 98.6°F | Resp 16 | Ht 65.5 in | Wt 159.1 lb

## 2025-01-01 DIAGNOSIS — N951 Menopausal and female climacteric states: Secondary | ICD-10-CM | POA: Insufficient documentation

## 2025-01-01 DIAGNOSIS — M810 Age-related osteoporosis without current pathological fracture: Secondary | ICD-10-CM

## 2025-01-01 DIAGNOSIS — Z13228 Encounter for screening for other metabolic disorders: Secondary | ICD-10-CM | POA: Insufficient documentation

## 2025-01-01 DIAGNOSIS — E785 Hyperlipidemia, unspecified: Secondary | ICD-10-CM | POA: Insufficient documentation

## 2025-01-01 DIAGNOSIS — M1990 Unspecified osteoarthritis, unspecified site: Secondary | ICD-10-CM | POA: Insufficient documentation

## 2025-01-01 DIAGNOSIS — R1013 Epigastric pain: Secondary | ICD-10-CM

## 2025-01-01 DIAGNOSIS — Z1211 Encounter for screening for malignant neoplasm of colon: Secondary | ICD-10-CM

## 2025-01-01 DIAGNOSIS — M549 Dorsalgia, unspecified: Secondary | ICD-10-CM | POA: Insufficient documentation

## 2025-01-01 DIAGNOSIS — M545 Low back pain, unspecified: Secondary | ICD-10-CM

## 2025-01-01 DIAGNOSIS — G8929 Other chronic pain: Secondary | ICD-10-CM | POA: Diagnosis not present

## 2025-01-01 DIAGNOSIS — R5381 Other malaise: Secondary | ICD-10-CM | POA: Insufficient documentation

## 2025-01-01 DIAGNOSIS — E66812 Obesity, class 2: Secondary | ICD-10-CM | POA: Insufficient documentation

## 2025-01-01 MED ORDER — TRAMADOL HCL 50 MG PO TABS
50.0000 mg | ORAL_TABLET | Freq: Two times a day (BID) | ORAL | 0 refills | Status: AC | PRN
  Filled 2025-01-01: qty 30, 15d supply, fill #0

## 2025-01-01 NOTE — Assessment & Plan Note (Signed)
 Chronic low back pain following four spinal surgeries since 2020. Pain persists despite multiple interventions. Discussion of potential adhesions as a cause of pain, but other causes need to be ruled out first. Consideration of alternative pain management options, including injections. - F/u with Neurosurgery as directed.  I did not agree to chronic Tramadol .  Orders:   traMADol  (ULTRAM ) 50 MG tablet; Take 1 tablet (50 mg total) by mouth every 12 (twelve) hours as needed for up to 14 days.

## 2025-01-01 NOTE — Progress Notes (Unsigned)
 "  Established Patient Office Visit  Subjective   Patient ID: Kendra Mann, female    DOB: January 31, 1966  Age: 59 y.o. MRN: [id number]  Chief Complaint  Patient presents with   Establish Care    Establish Care     Discussed the use of AI scribe software for clinical note transcription with the patient, who gave verbal consent to proceed.  History of Present Illness Kendra Mann is a 59 year old female who presents with persistent abdominal discomfort.  She has experienced persistent abdominal pain since Christmas, describing it as a twisting sensation in her intestines, which sometimes requires her to stretch her back. She also experiences indigestion-like symptoms but no heartburn. The pain worsens with eating. She has tried various medications, including antacids and Pepcid, with minimal relief.    She has a significant history of back issues, having undergone four back surgeries since 2020. The first surgery was a discectomy, followed by surgeries involving pins, plates, and a cage due to poor bone quality. The cage moved twice, necessitating further surgeries. Despite these interventions, she continues to experience significant back pain. She is under the care of Neurosurgery at [facility] and has an upcoming appointment.  Her family history includes emphysema, diabetes, COPD, and congestive heart failure in her parents. Her sister had breast cancer, but she has different fathers. No strong family history of cancer.  Socially, she works from home for [employer] and previously worked for [employer]. She is single, rarely consumes alcohol, and has a history of smoking in her thirties. She has not smoked recently.  In terms of medications, she has been prescribed dicyclomine  for abdominal pain, which she believes was given in the hospital. She has not had pain medication for over a month and is seeking a refill for hydrocodone , which she has used sparingly in the past. She has also used  ibuprofen, but is cautious due to potential stomach issues.    Past Medical History:  Diagnosis Date   Chronic back pain    f/by [facility] Neurosurgery   Former smoker    Brief distant history   Osteoarthritis    Osteoporosis     Outpatient Encounter Medications as of 01/01/2025  Medication Sig   acetaminophen  (TYLENOL ) 500 MG tablet Take 500-1,000 mg by mouth.   alendronate (FOSAMAX) 70 MG tablet Take 70 mg by mouth once a week.   diclofenac Sodium (VOLTAREN) 1 % GEL Apply 1 g topically 4 (four) times daily.   dicyclomine  (BENTYL ) 20 MG tablet Take 1 tablet (20 mg total) by mouth 2 (two) times daily.   ibuprofen (ADVIL) 800 MG tablet Take 800 mg by mouth every 6 (six) hours as needed.   lidocaine  (LIDODERM ) 5 % 1 patch daily.   LORazepam (ATIVAN) 1 MG tablet Take 1 mg by mouth daily as needed.   ondansetron  (ZOFRAN -ODT) 4 MG disintegrating tablet Take 1 tablet (4 mg total) by mouth every 8 (eight) hours as needed.   SYRINGE-NEEDLE, DISP, 3 ML (B-D 3CC LUER-LOK SYR 23GX1-1/2) 23G X 1-1/2 3 ML MISC Use for B-12 injections.   SYRINGE-NEEDLE, DISP, 3 ML (B-D 3CC LUER-LOK SYR 23GX1-1/2) 23G X 1-1/2 3 ML MISC Inject 1 each into the muscle.   traMADol  (ULTRAM ) 50 MG tablet Take 1 tablet (50 mg total) by mouth every 12 (twelve) hours as needed for up to 14 days.   [DISCONTINUED] HYDROcodone -acetaminophen  (NORCO) 10-325 MG tablet Take 1 tablet by mouth daily as needed.   [DISCONTINUED] Oxycodone  HCl 10  MG TABS Take 1 tablet (10 mg total) by mouth every 4 (four) hours as needed (pain.).   conjugated estrogens  (PREMARIN ) vaginal cream Place 0.5 Applicatorfuls vaginally once a week.  (Patient not taking: Reported on 01/01/2025)   gabapentin  (NEURONTIN ) 300 MG capsule Take 600 mg by mouth at bedtime. (Patient not taking: Reported on 01/01/2025)   methocarbamol  (ROBAXIN ) 500 MG tablet Take 1 tablet (500 mg total) by mouth every 6 (six) hours as needed for muscle spasms. (Patient not taking: Reported on  01/01/2025)   methocarbamol  (ROBAXIN ) 500 MG tablet Take 1 tablet (500 mg total) by mouth every 6 (six) hours as needed for muscle spasms. (Patient not taking: Reported on 01/01/2025)   No facility-administered encounter medications on file as of 01/01/2025.    Social History   Tobacco Use   Smoking status: Former    Types: Cigarettes   Smokeless tobacco: Never  Vaping Use   Vaping status: Never Used  Substance Use Topics   Alcohol use: Yes    Comment: rarely   Drug use: Never      Review of Systems  Constitutional:  Negative for fever and malaise/fatigue.  HENT:  Negative for hearing loss.   Cardiovascular:  Negative for chest pain.  Gastrointestinal:  Positive for abdominal pain. Negative for blood in stool, constipation, diarrhea, heartburn and nausea.  Genitourinary:  Negative for dysuria and hematuria.  Musculoskeletal:  Positive for back pain.  Skin:  Negative for rash.  Neurological:  Negative for dizziness, tingling, tremors, sensory change, focal weakness, seizures, loss of consciousness and headaches.  Psychiatric/Behavioral:  Negative for depression, hallucinations and memory loss. The patient does not have insomnia.       Objective:     BP 93/70 (BP Location: Right Arm, Patient Position: Sitting)   Pulse 78   Temp 98.6 F (37 C) (Oral)   Resp 16   Ht 5' 5.5 (1.664 m)   Wt 159 lb 1.6 oz (72.2 kg)   SpO2 97%   BMI 26.07 kg/m    Physical Exam Constitutional:      General: She is not in acute distress.    Appearance: Normal appearance.  HENT:     Head: Normocephalic.  Neck:     Vascular: No carotid bruit.  Cardiovascular:     Rate and Rhythm: Normal rate and regular rhythm.     Pulses: Normal pulses.     Heart sounds: Normal heart sounds.  Pulmonary:     Effort: Pulmonary effort is normal.     Breath sounds: Normal breath sounds.  Abdominal:     General: Bowel sounds are normal. There is no distension.     Palpations: Abdomen is soft. There is  no mass.     Tenderness: There is no abdominal tenderness. There is no guarding.  Musculoskeletal:     Cervical back: Neck supple. No tenderness.     Right lower leg: No edema.     Left lower leg: No edema.     Comments: Mild lumbago  Neurological:     General: No focal deficit present.     Mental Status: She is alert.  Psychiatric:        Mood and Affect: Mood normal.        Behavior: Behavior normal.      No results found for any visits on 01/01/25.    The ASCVD Risk score (Arnett DK, et al., 2019) failed to calculate for the following reasons:   Cannot find a previous HDL  lab   Cannot find a previous total cholesterol lab   * - Cholesterol units were assumed    Assessment & Plan:   Assessment & Plan Epigastric pain Epigastric pain since Christmas, exacerbated by meals, particularly fatty foods.  Differential includes gallbladder disease, adhesions, etc. Pain not relieved by antacids or Pepcid. - Ordered abdominal ultrasound to evaluate gallbladder - Advised dietary modification to avoid fatty, greasy foods Orders:   US  Abdomen Limited RUQ (LIVER/GB); Future   Amylase   CBC with Differential/Platelet   Comprehensive metabolic panel with GFR  Colon cancer screening Due for colonoscopy. Orders:   Ambulatory referral to Gastroenterology  Chronic bilateral low back pain without sciatica Chronic low back pain following four spinal surgeries since 2020. Pain persists despite multiple interventions. Discussion of potential adhesions as a cause of pain, but other causes need to be ruled out first. Consideration of alternative pain management options, including injections. - F/u with Neurosurgery as directed.  I did not agree to chronic Tramadol .  Orders:   traMADol  (ULTRAM ) 50 MG tablet; Take 1 tablet (50 mg total) by mouth every 12 (twelve) hours as needed for up to 14 days.  Osteoporosis without current pathological fracture, unspecified osteoporosis type Continue  Fosamax.      Return in about 4 weeks (around 01/29/2025) for chronic follow-up.    REDDING PONCE NORLEEN FALCON., MD  "

## 2025-01-02 LAB — COMPREHENSIVE METABOLIC PANEL WITH GFR
ALT: 7 [IU]/L (ref 0–32)
AST: 13 [IU]/L (ref 0–40)
Albumin: 4.6 g/dL (ref 3.8–4.9)
Alkaline Phosphatase: 48 [IU]/L — ABNORMAL LOW (ref 49–135)
BUN/Creatinine Ratio: 13 (ref 9–23)
BUN: 9 mg/dL (ref 6–24)
Bilirubin Total: 0.4 mg/dL (ref 0.0–1.2)
CO2: 22 mmol/L (ref 20–29)
Calcium: 9.3 mg/dL (ref 8.7–10.2)
Chloride: 102 mmol/L (ref 96–106)
Creatinine, Ser: 0.7 mg/dL (ref 0.57–1.00)
Globulin, Total: 1.9 g/dL (ref 1.5–4.5)
Glucose: 89 mg/dL (ref 70–99)
Potassium: 4.1 mmol/L (ref 3.5–5.2)
Sodium: 140 mmol/L (ref 134–144)
Total Protein: 6.5 g/dL (ref 6.0–8.5)
eGFR: 100 mL/min/{1.73_m2}

## 2025-01-02 LAB — CBC WITH DIFFERENTIAL/PLATELET
Basophils Absolute: 0 10*3/uL (ref 0.0–0.2)
Basos: 0 %
EOS (ABSOLUTE): 0.1 10*3/uL (ref 0.0–0.4)
Eos: 2 %
Hematocrit: 37.8 % (ref 34.0–46.6)
Hemoglobin: 12.8 g/dL (ref 11.1–15.9)
Immature Grans (Abs): 0 10*3/uL (ref 0.0–0.1)
Immature Granulocytes: 0 %
Lymphocytes Absolute: 1.2 10*3/uL (ref 0.7–3.1)
Lymphs: 18 %
MCH: 32 pg (ref 26.6–33.0)
MCHC: 33.9 g/dL (ref 31.5–35.7)
MCV: 95 fL (ref 79–97)
Monocytes Absolute: 0.3 10*3/uL (ref 0.1–0.9)
Monocytes: 5 %
Neutrophils Absolute: 5.2 10*3/uL (ref 1.4–7.0)
Neutrophils: 75 %
Platelets: 233 10*3/uL (ref 150–450)
RBC: 4 x10E6/uL (ref 3.77–5.28)
RDW: 12.2 % (ref 11.7–15.4)
WBC: 6.9 10*3/uL (ref 3.4–10.8)

## 2025-01-02 LAB — AMYLASE: Amylase: 72 U/L (ref 31–110)

## 2025-01-04 ENCOUNTER — Ambulatory Visit (HOSPITAL_BASED_OUTPATIENT_CLINIC_OR_DEPARTMENT_OTHER): Payer: Self-pay | Admitting: Family Medicine

## 2025-01-05 ENCOUNTER — Encounter (HOSPITAL_BASED_OUTPATIENT_CLINIC_OR_DEPARTMENT_OTHER): Payer: Self-pay

## 2025-01-12 ENCOUNTER — Other Ambulatory Visit (HOSPITAL_BASED_OUTPATIENT_CLINIC_OR_DEPARTMENT_OTHER): Admitting: Radiology

## 2025-01-19 ENCOUNTER — Other Ambulatory Visit (HOSPITAL_BASED_OUTPATIENT_CLINIC_OR_DEPARTMENT_OTHER): Admitting: Radiology

## 2025-02-01 ENCOUNTER — Ambulatory Visit (HOSPITAL_BASED_OUTPATIENT_CLINIC_OR_DEPARTMENT_OTHER): Admitting: Family Medicine
